# Patient Record
Sex: Male | Born: 1955 | Race: White | Hispanic: No | Marital: Married | Smoking: Never smoker
Health system: Southern US, Community
[De-identification: ages and names within clinical notes are randomized; demographics above are authoritative.]

## PROBLEM LIST (undated history)

## (undated) DIAGNOSIS — J310 Chronic rhinitis: Secondary | ICD-10-CM

## (undated) DIAGNOSIS — R011 Cardiac murmur, unspecified: Secondary | ICD-10-CM

## (undated) DIAGNOSIS — D649 Anemia, unspecified: Secondary | ICD-10-CM

## (undated) DIAGNOSIS — Q676 Pectus excavatum: Secondary | ICD-10-CM

## (undated) DIAGNOSIS — Z8619 Personal history of other infectious and parasitic diseases: Secondary | ICD-10-CM

## (undated) DIAGNOSIS — I340 Nonrheumatic mitral (valve) insufficiency: Secondary | ICD-10-CM

## (undated) DIAGNOSIS — G4730 Sleep apnea, unspecified: Secondary | ICD-10-CM

## (undated) DIAGNOSIS — K589 Irritable bowel syndrome without diarrhea: Secondary | ICD-10-CM

## (undated) DIAGNOSIS — D369 Benign neoplasm, unspecified site: Secondary | ICD-10-CM

## (undated) DIAGNOSIS — E291 Testicular hypofunction: Secondary | ICD-10-CM

## (undated) HISTORY — DX: Personal history of other infectious and parasitic diseases: Z86.19

## (undated) HISTORY — DX: Sleep apnea, unspecified: G47.30

## (undated) HISTORY — DX: Irritable bowel syndrome, unspecified: K58.9

## (undated) HISTORY — DX: Nonrheumatic mitral (valve) insufficiency: I34.0

## (undated) HISTORY — PX: MITRAL VALVE REPAIR: SHX2039

## (undated) HISTORY — DX: Testicular hypofunction: E29.1

---
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
## 1976-03-21 HISTORY — PX: VASECTOMY: SHX75

## 2004-06-11 ENCOUNTER — Ambulatory Visit: Payer: Self-pay

## 2005-05-05 ENCOUNTER — Ambulatory Visit: Payer: Self-pay | Admitting: Family Medicine

## 2008-07-11 ENCOUNTER — Ambulatory Visit: Payer: Self-pay | Admitting: Gastroenterology

## 2012-02-24 ENCOUNTER — Ambulatory Visit: Payer: Self-pay | Admitting: Gastroenterology

## 2012-02-24 DIAGNOSIS — Z86010 Personal history of colonic polyps: Secondary | ICD-10-CM | POA: Insufficient documentation

## 2012-02-24 DIAGNOSIS — Z860101 Personal history of adenomatous and serrated colon polyps: Secondary | ICD-10-CM | POA: Insufficient documentation

## 2012-02-24 LAB — HM COLONOSCOPY

## 2012-04-09 LAB — HM HEPATITIS C SCREENING LAB: HM HEPATITIS C SCREENING: NEGATIVE

## 2013-08-06 ENCOUNTER — Ambulatory Visit: Payer: Self-pay | Admitting: Family Medicine

## 2014-01-19 DEATH — deceased

## 2014-05-02 ENCOUNTER — Emergency Department: Payer: Self-pay | Admitting: Emergency Medicine

## 2015-03-20 ENCOUNTER — Telehealth: Payer: Self-pay | Admitting: Family Medicine

## 2015-03-20 NOTE — Telephone Encounter (Signed)
Left patient a voicemail advising him that a brief letter written by Mikki Santee is at the front desk for pickup, and per Mikki Santee if letter is not adequate to discuss with Dr. Rosanna Randy next week.

## 2015-03-20 NOTE — Telephone Encounter (Signed)
I have placed a brief note up front for pickup. If this is not adequate he will need to discuss this further with Dr. Rosanna Randy next week.

## 2015-03-20 NOTE — Telephone Encounter (Signed)
Pt needs a note for his flex spending saying he takes flonase OTC and has been purchasing this all year.  He needs this today in order to file the claim.  His call back is [phone]  Thanks Con Memos

## 2015-06-16 ENCOUNTER — Encounter: Payer: Self-pay | Admitting: Family Medicine

## 2015-06-16 ENCOUNTER — Ambulatory Visit (INDEPENDENT_AMBULATORY_CARE_PROVIDER_SITE_OTHER): Payer: Commercial Managed Care - PPO | Admitting: Family Medicine

## 2015-06-16 VITALS — BP 102/62 | HR 75 | Temp 98.3°F | Resp 16 | Wt 171.0 lb

## 2015-06-16 DIAGNOSIS — J101 Influenza due to other identified influenza virus with other respiratory manifestations: Secondary | ICD-10-CM | POA: Diagnosis not present

## 2015-06-16 DIAGNOSIS — R05 Cough: Secondary | ICD-10-CM

## 2015-06-16 DIAGNOSIS — E291 Testicular hypofunction: Secondary | ICD-10-CM | POA: Insufficient documentation

## 2015-06-16 DIAGNOSIS — J0110 Acute frontal sinusitis, unspecified: Secondary | ICD-10-CM | POA: Diagnosis not present

## 2015-06-16 DIAGNOSIS — R1110 Vomiting, unspecified: Secondary | ICD-10-CM

## 2015-06-16 DIAGNOSIS — S39012A Strain of muscle, fascia and tendon of lower back, initial encounter: Secondary | ICD-10-CM | POA: Insufficient documentation

## 2015-06-16 DIAGNOSIS — G4730 Sleep apnea, unspecified: Secondary | ICD-10-CM | POA: Insufficient documentation

## 2015-06-16 DIAGNOSIS — M545 Low back pain, unspecified: Secondary | ICD-10-CM | POA: Insufficient documentation

## 2015-06-16 DIAGNOSIS — I340 Nonrheumatic mitral (valve) insufficiency: Secondary | ICD-10-CM | POA: Insufficient documentation

## 2015-06-16 DIAGNOSIS — R059 Cough, unspecified: Secondary | ICD-10-CM

## 2015-06-16 DIAGNOSIS — IMO0001 Reserved for inherently not codable concepts without codable children: Secondary | ICD-10-CM | POA: Insufficient documentation

## 2015-06-16 DIAGNOSIS — J309 Allergic rhinitis, unspecified: Secondary | ICD-10-CM | POA: Insufficient documentation

## 2015-06-16 DIAGNOSIS — I341 Nonrheumatic mitral (valve) prolapse: Secondary | ICD-10-CM | POA: Insufficient documentation

## 2015-06-16 LAB — POCT INFLUENZA A/B
INFLUENZA B, POC: POSITIVE — AB
Influenza A, POC: NEGATIVE

## 2015-06-16 MED ORDER — OSELTAMIVIR PHOSPHATE 75 MG PO CAPS: 75.0000 mg | ORAL_CAPSULE | Freq: Two times a day (BID) | ORAL | Status: AC

## 2015-06-16 MED ORDER — AZITHROMYCIN 250 MG PO TABS: ORAL_TABLET | ORAL | Status: AC

## 2015-06-16 NOTE — Progress Notes (Signed)
       Patient: Michael Macias Male    DOB: Jul 19, 1955   60 y.o.   MRN: [id number] Visit Date: 06/16/2015  Today's Provider: Lelon Huh, MD    Subjective:    Cough This is a new problem. The current episode started in the past 7 days (5 days ago). The problem has been unchanged. The problem occurs constantly. The cough is productive of sputum. Associated symptoms include chest pain, headaches, nasal congestion, postnasal drip and rhinorrhea. Pertinent negatives include no chills, ear congestion, ear pain, fever, heartburn, hemoptysis, myalgias, rash, sore throat, shortness of breath, sweats or wheezing. Treatments tried: robitussin. The treatment provided moderate relief. His past medical history is significant for environmental allergies. There is no history of asthma, bronchiectasis, bronchitis, COPD, emphysema or pneumonia.   Sinus drainage started 5 days ago. Symptoms of cough, sweats, chills, body aches, sinus pressure, and chest congestion. Cough started started 3 days ago.    Allergies  Allergen Reactions  . Penicillins    Previous Medications   FLAXSEED, LINSEED, (FLAX SEED OIL PO)    For eyes   FLUTICASONE (FLONASE) 50 MCG/ACT NASAL SPRAY    Place 2 sprays into the nose daily.   TESTOSTERONE (TESTIM) 50 MG/5GM (1%) GEL    Place 1 packet onto the skin daily.    Review of Systems  Constitutional: Negative for fever and chills.  HENT: Positive for postnasal drip and rhinorrhea. Negative for ear pain and sore throat.   Respiratory: Positive for cough. Negative for hemoptysis, shortness of breath and wheezing.   Cardiovascular: Positive for chest pain.  Gastrointestinal: Negative for heartburn.  Musculoskeletal: Negative for myalgias.  Skin: Negative for rash.  Allergic/Immunologic: Positive for environmental allergies.  Neurological: Positive for headaches.    Social History  Substance Use Topics  . Smoking status: Never Smoker   . Smokeless tobacco: Never Used  .  Alcohol Use: 0.0 oz/week    0 Standard drinks or equivalent per week     Comment: drinks a couple of glasses of wine everyday   Objective:   BP 102/62 mmHg  Pulse 75  Temp(Src) 98.3 F (36.8 C) (Oral)  Resp 16  Wt 171 lb (77.565 kg)  SpO2 95%  Physical Exam  General Appearance:    Alert, cooperative, no distress  HENT:   bilateral TM normal without fluid or infection, neck without nodes, throat normal without erythema or exudate, frontal and maxillary sinus tender and nasal mucosa pale and congested  Eyes:    PERRL, conjunctiva/corneas clear, EOM's intact       Lungs:     Clear to auscultation bilaterally, respirations unlabored  Heart:    Regular rate and rhythm, II/VI systolic murmur  Neurologic:   Awake, alert, oriented x 3. No apparent focal neurological           defect.         Results for orders placed or performed in visit on 06/16/15  POCT Influenza A/B  Result Value Ref Range   Influenza A, POC Negative Negative   Influenza B, POC Positive (A) Negative       Assessment & Plan:     1. Cough  - POCT Influenza A/B  2. Influenza B Tqmiflu 75 bid x 5 d   3. Acute frontal sinusitis, recurrence not specified Zpack       Lelon Huh, MD  [facility]

## 2015-06-22 ENCOUNTER — Encounter: Payer: Self-pay | Admitting: Family Medicine

## 2015-09-03 DIAGNOSIS — E782 Mixed hyperlipidemia: Secondary | ICD-10-CM | POA: Insufficient documentation

## 2015-10-07 ENCOUNTER — Encounter: Payer: Self-pay | Admitting: *Deleted

## 2015-10-07 ENCOUNTER — Encounter
Admission: RE | Disposition: A | Discharge status: Home / Self Care | Payer: Self-pay | Source: Ambulatory Visit | Attending: Internal Medicine

## 2015-10-07 ENCOUNTER — Ambulatory Visit
Admission: RE | Admit: 2015-10-07 | Discharge: 2015-10-07 | Disposition: A | Discharge status: Home / Self Care | Payer: Commercial Managed Care - PPO | Source: Ambulatory Visit | Attending: Internal Medicine | Admitting: Internal Medicine

## 2015-10-07 DIAGNOSIS — Z9889 Other specified postprocedural states: Secondary | ICD-10-CM | POA: Insufficient documentation

## 2015-10-07 DIAGNOSIS — Z7951 Long term (current) use of inhaled steroids: Secondary | ICD-10-CM | POA: Diagnosis not present

## 2015-10-07 DIAGNOSIS — I340 Nonrheumatic mitral (valve) insufficiency: Secondary | ICD-10-CM | POA: Diagnosis not present

## 2015-10-07 DIAGNOSIS — Z7982 Long term (current) use of aspirin: Secondary | ICD-10-CM | POA: Diagnosis not present

## 2015-10-07 DIAGNOSIS — I517 Cardiomegaly: Secondary | ICD-10-CM | POA: Insufficient documentation

## 2015-10-07 DIAGNOSIS — E785 Hyperlipidemia, unspecified: Secondary | ICD-10-CM | POA: Diagnosis not present

## 2015-10-07 DIAGNOSIS — Z86010 Personal history of colonic polyps: Secondary | ICD-10-CM | POA: Insufficient documentation

## 2015-10-07 DIAGNOSIS — I10 Essential (primary) hypertension: Secondary | ICD-10-CM | POA: Insufficient documentation

## 2015-10-07 DIAGNOSIS — I341 Nonrheumatic mitral (valve) prolapse: Secondary | ICD-10-CM | POA: Diagnosis present

## 2015-10-07 DIAGNOSIS — Z9852 Vasectomy status: Secondary | ICD-10-CM | POA: Insufficient documentation

## 2015-10-07 DIAGNOSIS — G4730 Sleep apnea, unspecified: Secondary | ICD-10-CM | POA: Insufficient documentation

## 2015-10-07 DIAGNOSIS — J309 Allergic rhinitis, unspecified: Secondary | ICD-10-CM | POA: Insufficient documentation

## 2015-10-07 DIAGNOSIS — Z79899 Other long term (current) drug therapy: Secondary | ICD-10-CM | POA: Insufficient documentation

## 2015-10-07 DIAGNOSIS — Z881 Allergy status to other antibiotic agents status: Secondary | ICD-10-CM | POA: Diagnosis not present

## 2015-10-07 HISTORY — PX: CARDIAC CATHETERIZATION: SHX172

## 2015-10-07 SURGERY — RIGHT/LEFT HEART CATH AND CORONARY ANGIOGRAPHY
Anesthesia: Moderate Sedation

## 2015-10-07 SURGERY — ECHOCARDIOGRAM, TRANSESOPHAGEAL
Anesthesia: Moderate Sedation

## 2015-10-07 MED ORDER — SODIUM CHLORIDE 0.9 % WEIGHT BASED INFUSION: 1.0000 mL/kg/h | INTRAVENOUS | Status: DC

## 2015-10-07 MED ORDER — MIDAZOLAM HCL 2 MG/2ML IJ SOLN
INTRAMUSCULAR | Status: DC | PRN
  Administered 2015-10-07: 1 mg via INTRAVENOUS

## 2015-10-07 MED ORDER — MIDAZOLAM HCL 5 MG/5ML IJ SOLN
INTRAMUSCULAR | Status: AC | PRN
  Administered 2015-10-07 (×2): 2 mg via INTRAVENOUS

## 2015-10-07 MED ORDER — BUTAMBEN-TETRACAINE-BENZOCAINE 2-2-14 % EX AERO
INHALATION_SPRAY | CUTANEOUS | Status: AC
  Filled 2015-10-07: qty 20

## 2015-10-07 MED ORDER — SODIUM CHLORIDE 0.9% FLUSH: 3.0000 mL | INTRAVENOUS | Status: DC | PRN

## 2015-10-07 MED ORDER — LIDOCAINE VISCOUS 2 % MT SOLN
OROMUCOSAL | Status: AC
  Filled 2015-10-07: qty 15

## 2015-10-07 MED ORDER — MIDAZOLAM HCL 5 MG/5ML IJ SOLN
INTRAMUSCULAR | Status: AC
  Filled 2015-10-07: qty 5

## 2015-10-07 MED ORDER — FENTANYL CITRATE (PF) 100 MCG/2ML IJ SOLN
INTRAMUSCULAR | Status: AC | PRN
  Administered 2015-10-07 (×2): 50 ug via INTRAVENOUS

## 2015-10-07 MED ORDER — SODIUM CHLORIDE 0.9 % WEIGHT BASED INFUSION
3.0000 mL/kg/h | INTRAVENOUS | Status: DC
  Administered 2015-10-07: 3 mL/kg/h via INTRAVENOUS

## 2015-10-07 MED ORDER — SODIUM CHLORIDE 0.9 % WEIGHT BASED INFUSION: 3.0000 mL/kg/h | INTRAVENOUS | Status: DC

## 2015-10-07 MED ORDER — SODIUM CHLORIDE 0.9% FLUSH: 3.0000 mL | Freq: Two times a day (BID) | INTRAVENOUS | Status: DC

## 2015-10-07 MED ORDER — FENTANYL CITRATE (PF) 100 MCG/2ML IJ SOLN
INTRAMUSCULAR | Status: AC
  Filled 2015-10-07: qty 2

## 2015-10-07 MED ORDER — ASPIRIN 81 MG PO CHEW: 81.0000 mg | CHEWABLE_TABLET | ORAL | Status: DC

## 2015-10-07 MED ORDER — SODIUM CHLORIDE 0.9 % IV SOLN: 250.0000 mL | INTRAVENOUS | Status: DC | PRN

## 2015-10-07 MED ORDER — SODIUM CHLORIDE 0.9 % IV SOLN
INTRAVENOUS | Status: DC
  Administered 2015-10-07: 10:00:00 via INTRAVENOUS

## 2015-10-07 SURGICAL SUPPLY — 13 items
CATH INFINITI 5FR ANG PIGTAIL (CATHETERS) ×3 IMPLANT
CATH INFINITI 5FR JL4 (CATHETERS) ×3 IMPLANT
CATH INFINITI JR4 5F (CATHETERS) ×3 IMPLANT
CATH SWANZ 7F THERMO (CATHETERS) ×3 IMPLANT
DEVICE CLOSURE MYNXGRIP 5F (Vascular Products) ×3 IMPLANT
GUIDEWIRE EMER 3M J .025X150CM (WIRE) ×3 IMPLANT
KIT MANI 3VAL PERCEP (MISCELLANEOUS) ×3 IMPLANT
KIT RIGHT HEART (MISCELLANEOUS) ×3 IMPLANT
NEEDLE PERC 18GX7CM (NEEDLE) ×3 IMPLANT
PACK CARDIAC CATH (CUSTOM PROCEDURE TRAY) ×3 IMPLANT
SHEATH PINNACLE 5F 10CM (SHEATH) ×3 IMPLANT
SHEATH PINNACLE 7F 10CM (SHEATH) ×3 IMPLANT
WIRE EMERALD 3MM-J .035X150CM (WIRE) ×3 IMPLANT

## 2015-10-07 NOTE — Discharge Instructions (Signed)

## 2015-10-07 NOTE — Progress Notes (Signed)
*  PRELIMINARY RESULTS* Echocardiogram Echocardiogram Transesophageal has been performed.  Sherrie Sport 10/07/2015, 8:44 AM

## 2015-12-21 DIAGNOSIS — D62 Acute posthemorrhagic anemia: Secondary | ICD-10-CM | POA: Insufficient documentation

## 2015-12-21 DIAGNOSIS — G8918 Other acute postprocedural pain: Secondary | ICD-10-CM | POA: Insufficient documentation

## 2015-12-22 DIAGNOSIS — Z9889 Other specified postprocedural states: Secondary | ICD-10-CM | POA: Insufficient documentation

## 2015-12-23 DIAGNOSIS — Q676 Pectus excavatum: Secondary | ICD-10-CM | POA: Insufficient documentation

## 2016-02-26 DIAGNOSIS — R0782 Intercostal pain: Secondary | ICD-10-CM | POA: Insufficient documentation

## 2016-04-22 ENCOUNTER — Encounter: Payer: Self-pay | Admitting: Family Medicine

## 2016-04-22 ENCOUNTER — Ambulatory Visit (INDEPENDENT_AMBULATORY_CARE_PROVIDER_SITE_OTHER): Payer: Commercial Managed Care - PPO | Admitting: Family Medicine

## 2016-04-22 VITALS — BP 110/72 | HR 84 | Temp 98.5°F | Resp 16 | Wt 170.0 lb

## 2016-04-22 DIAGNOSIS — R194 Change in bowel habit: Secondary | ICD-10-CM

## 2016-04-22 NOTE — Progress Notes (Signed)
Patient: Michael Macias Male    DOB: 03-Jul-1955   61 y.o.   MRN: [id number] Visit Date: 04/22/2016  Today's Provider: Lelon Huh, MD   Chief Complaint  Patient presents with  . Nausea   Subjective:    HPI  Patient comes in today c/o nausea. He reports that his symptoms come and go. He has had symptoms for about 2 weeks. Patient denies any abdominal pain. He denies any fever. He has had diarrhea, but it has not been consistent. Patient denies any bloody or black stools. He reports that his symptoms seem to be worse at night. He has been taking Imodium to help relieve the diarrhea which has helped.  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 Has been trying to eat healthier eating more vegetables the last two months. No constipation. Has taken occasional Koa    Allergies  Allergen Reactions  . Penicillins Swelling and Rash    Has patient had a PCN reaction causing immediate rash, facial/tongue/throat swelling, SOB or lightheadedness with hypotension: Yes Has patient had a PCN reaction causing severe rash involving mucus membranes or skin necrosis: No Has patient had a PCN reaction that required hospitalization No Has patient had a PCN reaction occurring within the last 10 years: Yes If all of the above answers are "NO", then may proceed with Cephalosporin use.      Current Outpatient Prescriptions:  .  fluticasone (FLONASE) 50 MCG/ACT nasal spray, Place 2 sprays into the nose daily., Disp: , Rfl:  .  testosterone (TESTIM) 50 MG/5GM (1%) GEL, Place 1 packet onto the skin daily., Disp: , Rfl:   Review of Systems  Constitutional: Positive for appetite change. Negative for activity change, chills, diaphoresis, fatigue, fever and unexpected weight change.  Gastrointestinal: Positive for diarrhea and nausea. Negative for abdominal distention, abdominal pain, blood in stool, constipation, rectal pain and vomiting.  Genitourinary: Negative for difficulty urinating, dysuria,  flank pain, frequency and hematuria.  Neurological: Negative for weakness, light-headedness and headaches.    Social History  Substance Use Topics  . Smoking status: Never Smoker  . Smokeless tobacco: Never Used  . Alcohol use 0.0 oz/week     Comment: drinks a couple of glasses of wine everyday   Objective:   BP 110/72 (BP Location: Right Arm, Patient Position: Sitting, Cuff Size: Normal)   Pulse 84   Temp 98.5 F (36.9 C)   Resp 16   Wt 170 lb (77.1 kg)   SpO2 96%   BMI 28.29 kg/m   Physical Exam   General Appearance:    Alert, cooperative, no distress, obese  Eyes:    PERRL, conjunctiva/corneas clear, EOM's intact       Lungs:     Clear to auscultation bilaterally, respirations unlabored  Heart:    Regular rate and rhythm  Neurologic:   Awake, alert, oriented x 3. No apparent focal neurological           defect.            Assessment & Plan:     1. Change in bowel habits  - T4 AND TSH - Comprehensive metabolic panel - CBC - C difficile Toxins A+B W/Rflx - Fecal leukocytes - Ova and parasite examination - Stool culture  Patient Instructions   Try taking one heaping tablespoon metamucil mixed with liquid drink daily   Start taking an OTC    probiotic supplement  Such as Align once a day  The entirety of the information documented in the History of Present Illness, Review of Systems and Physical Exam were personally obtained by me. Portions of this information were initially documented by Wilburt Finlay, CMA and reviewed by me for thoroughness and accuracy.    Lelon Huh, MD  [facility]

## 2016-04-22 NOTE — Patient Instructions (Addendum)
   Try taking one heaping tablespoon metamucil mixed with liquid drink daily   Start taking an OTC    probiotic supplement  Such as Align once a day

## 2016-04-23 LAB — COMPREHENSIVE METABOLIC PANEL
A/G RATIO: 1.9 (ref 1.2–2.2)
ALK PHOS: 60 IU/L (ref 39–117)
ALT: 28 IU/L (ref 0–44)
AST: 24 IU/L (ref 0–40)
Albumin: 4.8 g/dL (ref 3.5–5.5)
BILIRUBIN TOTAL: 0.5 mg/dL (ref 0.0–1.2)
BUN/Creatinine Ratio: 17 (ref 9–20)
BUN: 18 mg/dL (ref 6–24)
CHLORIDE: 99 mmol/L (ref 96–106)
CO2: 24 mmol/L (ref 18–29)
Calcium: 9.8 mg/dL (ref 8.7–10.2)
Creatinine, Ser: 1.04 mg/dL (ref 0.76–1.27)
GFR calc Af Amer: 90 mL/min/{1.73_m2} (ref >59)
GFR, EST NON AFRICAN AMERICAN: 78 mL/min/{1.73_m2} (ref >59)
GLOBULIN, TOTAL: 2.5 g/dL (ref 1.5–4.5)
Glucose: 94 mg/dL (ref 65–99)
POTASSIUM: 4.7 mmol/L (ref 3.5–5.2)
SODIUM: 140 mmol/L (ref 134–144)
Total Protein: 7.3 g/dL (ref 6.0–8.5)

## 2016-04-23 LAB — T4 AND TSH
T4, Total: 5.9 ug/dL (ref 4.5–12.0)
TSH: 6.46 u[IU]/mL — AB (ref 0.450–4.500)

## 2016-04-23 LAB — CBC
Hematocrit: 41.6 % (ref 37.5–51.0)
Hemoglobin: 14.3 g/dL (ref 13.0–17.7)
MCH: 32.1 pg (ref 26.6–33.0)
MCHC: 34.4 g/dL (ref 31.5–35.7)
MCV: 94 fL (ref 79–97)
PLATELETS: 306 10*3/uL (ref 150–379)
RBC: 4.45 x10E6/uL (ref 4.14–5.80)
RDW: 13.1 % (ref 12.3–15.4)
WBC: 8.7 10*3/uL (ref 3.4–10.8)

## 2016-04-26 ENCOUNTER — Telehealth: Payer: Self-pay

## 2016-04-26 NOTE — Telephone Encounter (Signed)
-----   Message from Birdie Sons, MD sent at 04/24/2016  2:25 PM EST ----- He is slightly hyPOthyroid, but this should not be causing his symptoms. If not doing better then need referral to GI.

## 2016-04-26 NOTE — Telephone Encounter (Signed)
Pt reports he is feeling some better. Will call back at the end of the week if he feels he needs referral. Renaldo Fiddler, CMA

## 2016-05-04 ENCOUNTER — Telehealth: Payer: Self-pay

## 2016-05-04 DIAGNOSIS — R194 Change in bowel habit: Secondary | ICD-10-CM

## 2016-05-04 NOTE — Telephone Encounter (Signed)
Okay to refer. I have not seen patient since at least 2016

## 2016-05-04 NOTE — Telephone Encounter (Signed)
Please advise referral?  

## 2016-05-04 NOTE — Telephone Encounter (Signed)
I believe you saw pt for change in bowel in habits earlier this month. Is it okay to refer? Who would you recommend? Renaldo Fiddler, CMA

## 2016-05-04 NOTE — Telephone Encounter (Signed)
Patient is requesting to proceed with GI referral. He said he is not getting any better, and was told to call back if he needed to be referred.   CB#[id number].

## 2016-05-25 ENCOUNTER — Ambulatory Visit (INDEPENDENT_AMBULATORY_CARE_PROVIDER_SITE_OTHER): Payer: Commercial Managed Care - PPO | Admitting: Family Medicine

## 2016-05-25 VITALS — BP 110/78 | HR 64 | Temp 97.5°F | Resp 16 | Ht 64.5 in | Wt 172.0 lb

## 2016-05-25 DIAGNOSIS — Z1211 Encounter for screening for malignant neoplasm of colon: Secondary | ICD-10-CM | POA: Diagnosis not present

## 2016-05-25 DIAGNOSIS — Z0000 Encounter for general adult medical examination without abnormal findings: Secondary | ICD-10-CM

## 2016-05-25 DIAGNOSIS — Z125 Encounter for screening for malignant neoplasm of prostate: Secondary | ICD-10-CM

## 2016-05-25 DIAGNOSIS — E291 Testicular hypofunction: Secondary | ICD-10-CM

## 2016-05-25 NOTE — Progress Notes (Signed)
Patient: Michael Macias, Male    DOB: 08-07-1955, 61 y.o.   MRN: [id number] Visit Date: 05/25/2016  Today's Provider: Wilhemena Durie, MD   Chief Complaint  Patient presents with  . Annual Exam   Subjective:   Michael Macias is a 61 y.o. male who presents today for his Subsequent Annual Wellness Visit. He feels well. He reports exercising 5 days per week. He reports he is sleeping poorly.  Immunization History  Administered Date(s) Administered  . Tdap 04/04/2012   02/24/2012 Colonoscopy-Sessile serrated adenoma, repeat in 3 years  Review of Systems  Constitutional: Negative.   HENT: Negative.   Eyes: Negative.   Respiratory: Negative.   Cardiovascular: Negative.   Gastrointestinal: Negative.   Endocrine: Negative.   Genitourinary: Negative.        Pt has had some recent rectal discomfort which is improving. Also 4-5 liquid stools daily-some nocturnal,also improving.  Musculoskeletal: Negative.   Skin: Negative.   Allergic/Immunologic: Negative.   Neurological: Negative.   Hematological: Negative.   Psychiatric/Behavioral: Positive for sleep disturbance (falls asleep well but then wakes and is unable to go back.).    Patient Active Problem List   Diagnosis Date Noted  . 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 Apnea, sleep 06/16/2015  . Low back strain 06/16/2015  . Mitral regurgitation 06/16/2015  . Allergic rhinitis 06/16/2015  . Low back pain radiating to right leg 06/16/2015  . Regurgitation 06/16/2015  . History of adenomatous polyp of colon 02/24/2012  . IBS (irritable bowel syndrome) 02/01/2008    Social History   Social History  . Marital status: Married    Spouse name: N/A  . Number of children: 2  . Years of education: N/A   Occupational History  . [employer] Procedure department    Social History Main Topics  . Smoking status: Never Smoker  . Smokeless tobacco: Never Used  . Alcohol use 0.0 oz/week     Comment: drinks a couple of glasses of  wine everyday  . Drug use: No  . Sexual activity: Not on file   Other Topics Concern  . Not on file   Social History Narrative  . No narrative on file    Past Surgical History:  Procedure Laterality Date  . CARDIAC CATHETERIZATION N/A 10/07/2015   Procedure: Right/Left Heart Cath and Coronary Angiography;  Surgeon: Corey Skains, MD;  Location: [facility];  Service: Cardiovascular;  Laterality: N/A;  . RHINOPLASTY  1970   for deviated septum  . VASECTOMY  1978    His family history includes Breast cancer in his mother; Cancer in his maternal grandfather; Dementia in his father; Diabetes in his mother; Hyperlipidemia in his mother; Hypertension in his mother; Irritable bowel syndrome in his sister; Leukemia in his father; Pancreatic cancer in his maternal grandmother.     Outpatient Encounter Prescriptions as of 05/25/2016  Medication Sig  . aspirin EC 81 MG tablet Take 81 mg by mouth daily.  . fluticasone (FLONASE) 50 MCG/ACT nasal spray Place 2 sprays into the nose daily.  . Multiple Vitamins-Minerals (EYE VITAMINS) CAPS Take 1 capsule by mouth daily.  . Omega-3 Fatty Acids (FISH OIL) 1200 MG CAPS Take 1 capsule by mouth daily.  Marland Kitchen testosterone (TESTIM) 50 MG/5GM (1%) GEL Place 1 packet onto the skin daily.   No facility-administered encounter medications on file as of 05/25/2016.     Allergies  Allergen Reactions  . Amoxicillin Rash  . Penicillins Swelling and Rash  Has patient had a PCN reaction causing immediate rash, facial/tongue/throat swelling, SOB or lightheadedness with hypotension: Yes Has patient had a PCN reaction causing severe rash involving mucus membranes or skin necrosis: No Has patient had a PCN reaction that required hospitalization No Has patient had a PCN reaction occurring within the last 10 years: Yes If all of the above answers are "NO", then may proceed with Cephalosporin use. Has patient had a PCN reaction causing immediate rash,  facial/tongue/throat swelling, SOB or lightheadedness with hypotension: Yes Has patient had a PCN reaction causing severe rash involving mucus membranes or skin necrosis: No Has patient had a PCN reaction that required hospitalization No Has patient had a PCN reaction occurring within the last 10 years: Yes If all of the above answers are "NO", then may proceed with Cephalosporin use.     Patient Care Team: Jerrol Banana., MD as PCP - General (Family Medicine) Lollie Sails, MD as Consulting Physician (Gastroenterology) Augustina Mood, PA-C (Dentistry) Royston Cowper, MD (Urology)   Objective:   Vitals:  Vitals:   05/25/16 0910  BP: 110/78  Pulse: 64  Resp: 16  Temp: 97.5 F (36.4 C)  TempSrc: Oral  Weight: 172 lb (78 kg)  Height: 5' 4.5" (1.638 m)    Physical Exam  Constitutional: He is oriented to person, place, and time. He appears well-developed and well-nourished.  HENT:  Head: Normocephalic and atraumatic.  Right Ear: External ear normal.  Left Ear: External ear normal.  Nose: Nose normal.  Mouth/Throat: Oropharynx is clear and moist.  Eyes: Conjunctivae and EOM are normal. Pupils are equal, round, and reactive to light.  Neck: Normal range of motion. Neck supple.  Cardiovascular: Normal rate, regular rhythm, normal heart sounds and intact distal pulses.   Pulmonary/Chest: Effort normal and breath sounds normal.  Abdominal: Soft. Bowel sounds are normal.  Genitourinary: Penis normal.  Musculoskeletal: Normal range of motion.  Neurological: He is alert and oriented to person, place, and time.  Skin: Skin is warm and dry.  Psychiatric: He has a normal mood and affect. His behavior is normal. Judgment and thought content normal.    Activities of Daily Living In your present state of health, do you have any difficulty performing the following activities: 05/25/2016  Hearing? N  Vision? N  Difficulty concentrating or making decisions? N  Walking or  climbing stairs? N  Doing errands, shopping? N  Some recent data might be hidden    Fall Risk Assessment Fall Risk  05/25/2016  Falls in the past year? No     Depression Screen PHQ 2/9 Scores 05/25/2016  PHQ - 2 Score 0   Current Exercise Habits: Structured exercise class, Time (Minutes): 30, Frequency (Times/Week): 5, Weekly Exercise (Minutes/Week): 150 Exercise limited by: cardiac condition(s)    Assessment & Plan:     Annual Wellness Visit  Reviewed patient's Family Medical History Reviewed and updated list of patient's medical providers Assessment of cognitive impairment was done Assessed patient's functional ability Established a written schedule for health screening [place] Completed and Reviewed  Exercise Activities and Dietary recommendations Goals    None      Immunization History  Administered Date(s) Administered  . Tdap 04/04/2012    Health Maintenance  Topic Date Due  . HIV Screening  11/20/1971  . COLONOSCOPY  02/24/2015  . INFLUENZA VACCINE  10/20/2015  . TETANUS/TDAP  04/04/2022  . Hepatitis C Screening  Completed    Refer for colonoscopy. Discussed  health benefits of physical activity, and encouraged him to engage in regular exercise appropriate for his age and condition.  I have done the exam and reviewed the chart and it is accurate to the best of my knowledge. Development worker, community has been used and  any errors in dictation or transcription are unintentional. Miguel Aschoff M.D. [facility]

## 2016-05-27 ENCOUNTER — Telehealth: Payer: Self-pay | Admitting: Emergency Medicine

## 2016-05-27 ENCOUNTER — Other Ambulatory Visit: Payer: Self-pay | Admitting: Emergency Medicine

## 2016-05-27 DIAGNOSIS — E291 Testicular hypofunction: Secondary | ICD-10-CM

## 2016-05-27 LAB — LIPID PANEL WITH LDL/HDL RATIO
CHOLESTEROL TOTAL: 218 mg/dL — AB (ref 100–199)
HDL: 47 mg/dL (ref >39)
LDL CALC: 151 mg/dL — AB (ref 0–99)
LDl/HDL Ratio: 3.2 ratio units (ref 0.0–3.6)
Triglycerides: 99 mg/dL (ref 0–149)
VLDL CHOLESTEROL CAL: 20 mg/dL (ref 5–40)

## 2016-05-27 LAB — CBC WITH DIFFERENTIAL/PLATELET
BASOS ABS: 0.1 10*3/uL (ref 0.0–0.2)
Basos: 1 %
EOS (ABSOLUTE): 0.2 10*3/uL (ref 0.0–0.4)
Eos: 4 %
HEMOGLOBIN: 15.5 g/dL (ref 13.0–17.7)
Hematocrit: 45 % (ref 37.5–51.0)
IMMATURE GRANS (ABS): 0 10*3/uL (ref 0.0–0.1)
Immature Granulocytes: 0 %
LYMPHS: 36 %
Lymphocytes Absolute: 2 10*3/uL (ref 0.7–3.1)
MCH: 32.8 pg (ref 26.6–33.0)
MCHC: 34.4 g/dL (ref 31.5–35.7)
MCV: 95 fL (ref 79–97)
MONOCYTES: 9 %
Monocytes Absolute: 0.5 10*3/uL (ref 0.1–0.9)
Neutrophils Absolute: 2.9 10*3/uL (ref 1.4–7.0)
Neutrophils: 50 %
PLATELETS: 285 10*3/uL (ref 150–379)
RBC: 4.72 x10E6/uL (ref 4.14–5.80)
RDW: 13.8 % (ref 12.3–15.4)
WBC: 5.7 10*3/uL (ref 3.4–10.8)

## 2016-05-27 LAB — COMPREHENSIVE METABOLIC PANEL
ALBUMIN: 5.1 g/dL (ref 3.5–5.5)
ALK PHOS: 56 IU/L (ref 39–117)
ALT: 26 IU/L (ref 0–44)
AST: 24 IU/L (ref 0–40)
Albumin/Globulin Ratio: 1.8 (ref 1.2–2.2)
BUN / CREAT RATIO: 16 (ref 9–20)
BUN: 15 mg/dL (ref 6–24)
Bilirubin Total: 0.7 mg/dL (ref 0.0–1.2)
CO2: 24 mmol/L (ref 18–29)
CREATININE: 0.94 mg/dL (ref 0.76–1.27)
Calcium: 10 mg/dL (ref 8.7–10.2)
Chloride: 101 mmol/L (ref 96–106)
GFR calc Af Amer: 102 mL/min/{1.73_m2} (ref >59)
GFR calc non Af Amer: 88 mL/min/{1.73_m2} (ref >59)
GLUCOSE: 101 mg/dL — AB (ref 65–99)
Globulin, Total: 2.8 g/dL (ref 1.5–4.5)
Potassium: 5.8 mmol/L — ABNORMAL HIGH (ref 3.5–5.2)
Sodium: 143 mmol/L (ref 134–144)
TOTAL PROTEIN: 7.9 g/dL (ref 6.0–8.5)

## 2016-05-27 LAB — TESTOSTERONE: Testosterone: 254 ng/dL — ABNORMAL LOW (ref 264–916)

## 2016-05-27 LAB — PSA: PROSTATE SPECIFIC AG, SERUM: 0.3 ng/mL (ref 0.0–4.0)

## 2016-05-27 LAB — PROLACTIN: Prolactin: 8 ng/mL (ref 4.0–15.2)

## 2016-05-27 LAB — TSH: TSH: 9.34 u[IU]/mL — ABNORMAL HIGH (ref 0.450–4.500)

## 2016-05-27 NOTE — Telephone Encounter (Signed)
Testosterone is low, potassium is high. Cholesterol is borderline. TSH is up also showing hypothyroid. All other labs are normal. Should follow up with Dr. Darnell Level on treatment options.

## 2016-05-27 NOTE — Telephone Encounter (Signed)
Patient called back and wanted to have another provider to look at his results please. Dr Alben Spittle patient. Please let him know today he asked. Thank you-aa

## 2016-05-27 NOTE — Telephone Encounter (Signed)
Patient advised of all information below. Patient is not on any thyroid medication. Currently is not using Testim gel and said you guys decided to defer to urology about that. Please review results. Patient advised we will call him back once we hear from Dr Rometta Emery

## 2016-05-27 NOTE — Telephone Encounter (Signed)
Please call pt, Im on the phone :)-aa

## 2016-05-27 NOTE — Telephone Encounter (Signed)
Pt requesting lab results. Informed you are not in office on fridays but wanted me to sent you a message.

## 2016-05-30 NOTE — Telephone Encounter (Signed)
Pt stated he is still waiting to hear back from Dr. Rosanna Randy and request a call back. Please advise. Thanks TNP

## 2016-05-31 MED ORDER — TESTOSTERONE 50 MG/5GM (1%) TD GEL
10.0000 g | Freq: Every day | TRANSDERMAL | 5 refills | Status: DC
Start: 2016-05-31 — End: 2016-06-01

## 2016-05-31 NOTE — Telephone Encounter (Signed)
Hypothyroid--start synthroid 79mcg daily. Testosterone still low--double dosing of topical testoserone. RTC 3 months. Will also repeat renal panel in addition to TSH and Testosterone. LMTCB on 2 numbers. Dr Rosanna Randy reviewed results today.-aa

## 2016-05-31 NOTE — Telephone Encounter (Signed)
Double dose on the Sig.

## 2016-05-31 NOTE — Telephone Encounter (Signed)
Advised patient as below. Patient is requesting that testosterone cream be called into the pharmacy as well. Being that you want the patient to take a double dose, should I keep the Sig the same? Please advise. Thanks!

## 2016-06-01 ENCOUNTER — Other Ambulatory Visit: Payer: Self-pay

## 2016-06-01 DIAGNOSIS — E291 Testicular hypofunction: Secondary | ICD-10-CM

## 2016-06-01 MED ORDER — TESTOSTERONE 50 MG/5GM (1%) TD GEL: TRANSDERMAL | 5 refills | Status: DC

## 2016-06-01 MED ORDER — LEVOTHYROXINE SODIUM 50 MCG PO TABS: 50.0000 ug | ORAL_TABLET | Freq: Every day | ORAL | 3 refills | Status: DC

## 2016-07-25 ENCOUNTER — Other Ambulatory Visit: Payer: Self-pay

## 2016-10-11 ENCOUNTER — Ambulatory Visit (INDEPENDENT_AMBULATORY_CARE_PROVIDER_SITE_OTHER): Payer: Commercial Managed Care - PPO | Admitting: Family Medicine

## 2016-10-11 VITALS — BP 110/70 | HR 64 | Temp 97.0°F | Resp 16 | Wt 180.0 lb

## 2016-10-11 DIAGNOSIS — E291 Testicular hypofunction: Secondary | ICD-10-CM | POA: Diagnosis not present

## 2016-10-11 DIAGNOSIS — E039 Hypothyroidism, unspecified: Secondary | ICD-10-CM

## 2016-10-11 DIAGNOSIS — E875 Hyperkalemia: Secondary | ICD-10-CM

## 2016-10-11 DIAGNOSIS — Z6829 Body mass index (BMI) 29.0-29.9, adult: Secondary | ICD-10-CM

## 2016-10-11 NOTE — Progress Notes (Signed)
Michael Macias  MRN: [id number] DOB: 1956/02/23  Subjective:  HPI   The patient is a 61 year old male who presents for follow up on Thyroid disease and Hypogonadism.  His last visit was on  05/25/16.  At that time his labs revealed an elevated TSH, Low Testosterone and elevated Potassium.    The patient was started on Levothyroxine for the thyroid disease.  He reports that since starting it he has gained 8 pounds and feels bloated and sometime a little strange sensation, such as being in a fog and unable to concentrate.  This does not last long but happens about 1 hour after taking his medication.    The patient is also here for his testosterone level.  He was instructed to double the dose of Testim  He reports that he has been trying to do this.  However, when he does use the increased dose he finds that it breaks him out on his face only.  He states that he thinks his follow up appointment with Dr Rogers Blocker is in August.  On his last visit he also had some complaints of stomach issues and noted it was time for his colonoscopy.  He has that scheduled for August 3 with Dr Vira Agar.  While in our office last time office he mentioned the GI complaints and was instructed to start Metamucil 4 tabs daily and Probiotic every other day.  He reports that he thinks this has been helping.  The patient had a slightly elevated potassium on his last labs and needs to have this rechecked today.  Patient has a BMI of 29.10 on his last visit.   His weight has gone up 8 pounds since that time.  He has been started on the thryoid medications and labs will be checked today.  Patient Active Problem List   Diagnosis Date Noted  . 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 Apnea, sleep 06/16/2015  . Low back strain 06/16/2015  . Mitral regurgitation 06/16/2015  . Allergic rhinitis 06/16/2015  . Low back pain radiating to right leg 06/16/2015  . Regurgitation 06/16/2015  . History of adenomatous polyp of colon 02/24/2012  .  IBS (irritable bowel syndrome) 02/01/2008    Past Medical History:  Diagnosis Date  . History of chicken pox   . History of measles   . History of mumps   . Hypogonadism male   . IBS (irritable bowel syndrome)   . Mitral regurgitation   . Sleep apnea     Social History   Social History  . Marital status: Married    Spouse name: N/A  . Number of children: 2  . Years of education: N/A   Occupational History  . [employer] department    Social History Main Topics  . Smoking status: Never Smoker  . Smokeless tobacco: Never Used  . Alcohol use 0.0 oz/week     Comment: drinks a couple of glasses of wine everyday  . Drug use: No  . Sexual activity: Not on file   Other Topics Concern  . Not on file   Social History Narrative  . No narrative on file    Outpatient Encounter Prescriptions as of 10/11/2016  Medication Sig  . aspirin EC 81 MG tablet Take 81 mg by mouth daily.  . fluticasone (FLONASE) 50 MCG/ACT nasal spray Place 2 sprays into the nose daily.  Marland Kitchen levothyroxine (SYNTHROID, LEVOTHROID) 50 MCG tablet Take 1 tablet (50 mcg total) by mouth daily.  Marland Kitchen  Melatonin 10 MG CAPS Take 1 capsule by mouth.  . Multiple Vitamins-Minerals (EYE VITAMINS) CAPS Take 1 capsule by mouth daily.  . Omega-3 Fatty Acids (FISH OIL) 1200 MG CAPS Take 1 capsule by mouth daily.  Marland Kitchen testosterone (TESTIM) 50 MG/5GM (1%) GEL Apply 2 tubes daily   No facility-administered encounter medications on file as of 10/11/2016.     Allergies  Allergen Reactions  . Amoxicillin Rash  . Penicillins Swelling and Rash    Has patient had a PCN reaction causing immediate rash, facial/tongue/throat swelling, SOB or lightheadedness with hypotension: Yes Has patient had a PCN reaction causing severe rash involving mucus membranes or skin necrosis: No Has patient had a PCN reaction that required hospitalization No Has patient had a PCN reaction occurring within the last 10 years: Yes If all of  the above answers are "NO", then may proceed with Cephalosporin use. Has patient had a PCN reaction causing immediate rash, facial/tongue/throat swelling, SOB or lightheadedness with hypotension: Yes Has patient had a PCN reaction causing severe rash involving mucus membranes or skin necrosis: No Has patient had a PCN reaction that required hospitalization No Has patient had a PCN reaction occurring within the last 10 years: Yes If all of the above answers are "NO", then may proceed with Cephalosporin use.     Review of Systems  Constitutional: Negative for fever and malaise/fatigue.  Respiratory: Positive for cough (? allergies). Negative for shortness of breath and wheezing.   Cardiovascular: Negative for chest pain, palpitations, orthopnea, claudication and leg swelling.  Neurological: Negative for weakness.  Psychiatric/Behavioral: Negative for depression, hallucinations, memory loss, substance abuse and suicidal ideas. The patient is not nervous/anxious and does not have insomnia.     Objective:  BP 110/70 (BP Location: Right Arm, Patient Position: Sitting, Cuff Size: Normal)   Pulse 64   Temp (!) 97 F (36.1 C) (Oral)   Resp 16   Wt 180 lb (81.6 kg)   BMI 30.42 kg/m   Physical Exam  Constitutional: He is well-developed, well-nourished, and in no distress.  HENT:  Head: Normocephalic.  Eyes: Pupils are equal, round, and reactive to light.  Neck: Normal range of motion.  Cardiovascular: Normal rate, regular rhythm and normal heart sounds.   Pulmonary/Chest: Effort normal and breath sounds normal.  Psychiatric: Mood, memory, affect and judgment normal.    Assessment and Plan :   1. Male hypogonadism Patient is going to bee seeing Dr Rogers Blocker for this issue.  Will await lab results and have copy availalble to him. - Testosterone  2. Acquired hypothyroidism Obtain labs today - TSH  3. Hyperkalemia Recheck labs today - Renal function panel  4. BMI  29.0-29.9,adult Patient has recently been diagnosed with hypothyroidism.  He has started treatment.  Have discussed also with the patient working on habits.      HPI, Exam and A&P Transcribed under the direction and in the presence of Miguel Aschoff, Brooke Bonito., MD. Electronically Signed: Althea Charon, RMA I have done the exam and reviewed the chart and it is accurate to the best of my knowledge. Development worker, community has been used and  any errors in dictation or transcription are unintentional. Miguel Aschoff M.D. [facility]

## 2016-10-11 NOTE — Patient Instructions (Signed)
Keep appointments with Urology and Gastroenterology.  Bring Testosterone level with you to the Urology appointment

## 2016-10-12 ENCOUNTER — Telehealth: Payer: Self-pay

## 2016-10-12 LAB — RENAL FUNCTION PANEL
Albumin: 4.7 g/dL (ref 3.5–5.5)
BUN/Creatinine Ratio: 13 (ref 9–20)
BUN: 14 mg/dL (ref 6–24)
CO2: 23 mmol/L (ref 20–29)
Calcium: 9.9 mg/dL (ref 8.7–10.2)
Chloride: 101 mmol/L (ref 96–106)
Creatinine, Ser: 1.08 mg/dL (ref 0.76–1.27)
GFR calc Af Amer: 86 mL/min/{1.73_m2} (ref >59)
GFR calc non Af Amer: 75 mL/min/{1.73_m2} (ref >59)
Glucose: 106 mg/dL — ABNORMAL HIGH (ref 65–99)
Phosphorus: 3.7 mg/dL (ref 2.5–4.5)
Potassium: 5.6 mmol/L — ABNORMAL HIGH (ref 3.5–5.2)
Sodium: 140 mmol/L (ref 134–144)

## 2016-10-12 LAB — TSH: TSH: 4.76 u[IU]/mL — ABNORMAL HIGH (ref 0.450–4.500)

## 2016-10-12 LAB — TESTOSTERONE: Testosterone: 607 ng/dL (ref 264–916)

## 2016-10-12 MED ORDER — LEVOTHYROXINE SODIUM 75 MCG PO TABS: 75.0000 ug | ORAL_TABLET | Freq: Every day | ORAL | 3 refills | Status: DC

## 2016-10-12 NOTE — Telephone Encounter (Signed)
Patient advised. RX for Levothyroxine [facility] mcg sent to [facility].

## 2016-10-20 ENCOUNTER — Encounter: Payer: Self-pay | Admitting: *Deleted

## 2016-10-21 ENCOUNTER — Ambulatory Visit: Payer: Commercial Managed Care - PPO | Admitting: Anesthesiology

## 2016-10-21 ENCOUNTER — Ambulatory Visit
Admission: RE | Admit: 2016-10-21 | Discharge: 2016-10-21 | Disposition: A | Discharge status: Home / Self Care | Payer: Commercial Managed Care - PPO | Source: Ambulatory Visit | Attending: Gastroenterology | Admitting: Gastroenterology

## 2016-10-21 ENCOUNTER — Encounter
Admission: RE | Disposition: A | Discharge status: Home / Self Care | Payer: Self-pay | Source: Ambulatory Visit | Attending: Gastroenterology

## 2016-10-21 ENCOUNTER — Encounter: Payer: Self-pay | Admitting: *Deleted

## 2016-10-21 DIAGNOSIS — K635 Polyp of colon: Secondary | ICD-10-CM | POA: Insufficient documentation

## 2016-10-21 DIAGNOSIS — D649 Anemia, unspecified: Secondary | ICD-10-CM | POA: Diagnosis not present

## 2016-10-21 DIAGNOSIS — Z7982 Long term (current) use of aspirin: Secondary | ICD-10-CM | POA: Diagnosis not present

## 2016-10-21 DIAGNOSIS — E230 Hypopituitarism: Secondary | ICD-10-CM | POA: Diagnosis not present

## 2016-10-21 DIAGNOSIS — I340 Nonrheumatic mitral (valve) insufficiency: Secondary | ICD-10-CM | POA: Diagnosis not present

## 2016-10-21 DIAGNOSIS — K640 First degree hemorrhoids: Secondary | ICD-10-CM | POA: Insufficient documentation

## 2016-10-21 DIAGNOSIS — Z86010 Personal history of colonic polyps: Secondary | ICD-10-CM | POA: Diagnosis not present

## 2016-10-21 DIAGNOSIS — Q676 Pectus excavatum: Secondary | ICD-10-CM | POA: Insufficient documentation

## 2016-10-21 DIAGNOSIS — K5730 Diverticulosis of large intestine without perforation or abscess without bleeding: Secondary | ICD-10-CM | POA: Diagnosis not present

## 2016-10-21 DIAGNOSIS — Z880 Allergy status to penicillin: Secondary | ICD-10-CM | POA: Diagnosis not present

## 2016-10-21 DIAGNOSIS — G4730 Sleep apnea, unspecified: Secondary | ICD-10-CM | POA: Insufficient documentation

## 2016-10-21 DIAGNOSIS — Z1211 Encounter for screening for malignant neoplasm of colon: Secondary | ICD-10-CM | POA: Insufficient documentation

## 2016-10-21 DIAGNOSIS — K621 Rectal polyp: Secondary | ICD-10-CM | POA: Diagnosis not present

## 2016-10-21 DIAGNOSIS — D120 Benign neoplasm of cecum: Secondary | ICD-10-CM | POA: Diagnosis not present

## 2016-10-21 DIAGNOSIS — D122 Benign neoplasm of ascending colon: Secondary | ICD-10-CM | POA: Diagnosis not present

## 2016-10-21 DIAGNOSIS — K589 Irritable bowel syndrome without diarrhea: Secondary | ICD-10-CM | POA: Diagnosis not present

## 2016-10-21 HISTORY — DX: Pectus excavatum: Q67.6

## 2016-10-21 HISTORY — DX: Anemia, unspecified: D64.9

## 2016-10-21 HISTORY — DX: Cardiac murmur, unspecified: R01.1

## 2016-10-21 HISTORY — DX: Chronic rhinitis: J31.0

## 2016-10-21 HISTORY — DX: Benign neoplasm, unspecified site: D36.9

## 2016-10-21 HISTORY — PX: COLONOSCOPY WITH PROPOFOL: SHX5780

## 2016-10-21 SURGERY — COLONOSCOPY WITH PROPOFOL
Anesthesia: General

## 2016-10-21 MED ORDER — MIDAZOLAM HCL 5 MG/5ML IJ SOLN
INTRAMUSCULAR | Status: DC | PRN
  Administered 2016-10-21: 2 mg via INTRAVENOUS

## 2016-10-21 MED ORDER — PROPOFOL 10 MG/ML IV BOLUS
INTRAVENOUS | Status: DC | PRN
  Administered 2016-10-21: 20 mg via INTRAVENOUS
  Administered 2016-10-21: 50 mg via INTRAVENOUS

## 2016-10-21 MED ORDER — LIDOCAINE HCL (PF) 2 % IJ SOLN
INTRAMUSCULAR | Status: DC | PRN
Start: 2016-10-21 — End: 2016-10-21
  Administered 2016-10-21: 50 mg

## 2016-10-21 MED ORDER — PROPOFOL 500 MG/50ML IV EMUL
INTRAVENOUS | Status: DC | PRN
  Administered 2016-10-21: 75 ug/kg/min via INTRAVENOUS

## 2016-10-21 MED ORDER — CLINDAMYCIN PHOSPHATE 600 MG/50ML IV SOLN
600.0000 mg | Freq: Once | INTRAVENOUS | Status: AC
  Administered 2016-10-21: 600 mg via INTRAVENOUS
  Filled 2016-10-21: qty 50

## 2016-10-21 MED ORDER — PHENYLEPHRINE HCL 10 MG/ML IJ SOLN
INTRAMUSCULAR | Status: DC | PRN
  Administered 2016-10-21: 100 ug via INTRAVENOUS

## 2016-10-21 MED ORDER — SODIUM CHLORIDE 0.9 % IV SOLN
INTRAVENOUS | Status: DC
  Administered 2016-10-21: 13:00:00 via INTRAVENOUS

## 2016-10-21 MED ORDER — FENTANYL CITRATE (PF) 100 MCG/2ML IJ SOLN
INTRAMUSCULAR | Status: AC
  Filled 2016-10-21: qty 2

## 2016-10-21 MED ORDER — MIDAZOLAM HCL 2 MG/2ML IJ SOLN
INTRAMUSCULAR | Status: AC
  Filled 2016-10-21: qty 2

## 2016-10-21 MED ORDER — SODIUM CHLORIDE 0.9 % IV SOLN: INTRAVENOUS | Status: DC

## 2016-10-21 MED ORDER — FENTANYL CITRATE (PF) 100 MCG/2ML IJ SOLN
INTRAMUSCULAR | Status: DC | PRN
  Administered 2016-10-21 (×2): 50 ug via INTRAVENOUS

## 2016-10-21 MED ORDER — LIDOCAINE HCL (PF) 2 % IJ SOLN
INTRAMUSCULAR | Status: AC
  Filled 2016-10-21: qty 2

## 2016-10-21 NOTE — Anesthesia Post-op Follow-up Note (Cosign Needed)
Anesthesia QCDR form completed.        

## 2016-10-21 NOTE — Transfer of Care (Signed)
Immediate Anesthesia Transfer of Care Note  Patient: Michael Macias  Procedure(s) Performed: Procedure(s): COLONOSCOPY WITH PROPOFOL (N/A)  Patient Location: PACU  Anesthesia Type:General  Level of Consciousness: sedated  Airway & Oxygen Therapy: Patient Spontanous Breathing and Patient connected to nasal cannula oxygen  Post-op Assessment: Report given to RN and Post -op Vital signs reviewed and stable  Post vital signs: Reviewed and stable  Last Vitals:  Vitals:   10/21/16 1210  BP: (!) 133/96  Pulse: 78  Resp: 18  Temp: 36.6 C    Last Pain:  Vitals:   10/21/16 1210  TempSrc: Tympanic         Complications: No apparent anesthesia complications

## 2016-10-21 NOTE — Anesthesia Preprocedure Evaluation (Signed)
Anesthesia Evaluation  Patient identified by MRN, date of birth, ID band Patient awake    Reviewed: Allergy & Precautions, H&P , NPO status , Patient's Chart, lab work & pertinent test results, reviewed documented beta blocker date and time   Airway Mallampati: II   Neck ROM: full    Dental  (+) Poor Dentition   Pulmonary neg pulmonary ROS,    Pulmonary exam normal        Cardiovascular negative cardio ROS Normal cardiovascular exam+ Valvular Problems/Murmurs  Rhythm:regular Rate:Normal     Neuro/Psych  Neuromuscular disease negative neurological ROS  negative psych ROS   GI/Hepatic negative GI ROS, Neg liver ROS,   Endo/Other  negative endocrine ROS  Renal/GU negative Renal ROS  negative genitourinary   Musculoskeletal   Abdominal   Peds  Hematology negative hematology ROS (+) anemia ,   Anesthesia Other Findings Past Medical History: No date: Adenomatous polyps No date: Anemia No date: Heart murmur No date: History of chicken pox No date: History of measles No date: History of mumps No date: Hypogonadism male No date: IBS (irritable bowel syndrome) No date: Mitral regurgitation No date: Pectus excavatum No date: Rhinitis No date: Sleep apnea Past Surgical History: 10/07/2015: CARDIAC CATHETERIZATION; N/A     Comment:  Procedure: Right/Left Heart Cath and Coronary               Angiography;  Surgeon: Corey Skains, MD;  Location:               [place] [facility];  Service: Cardiovascular;                Laterality: N/A; No date: MITRAL VALVE REPAIR 1970: RHINOPLASTY     Comment:  for deviated septum 1978: VASECTOMY   Reproductive/Obstetrics negative OB ROS                             Anesthesia Physical Anesthesia Plan  ASA: II  Anesthesia Plan: General   Post-op Pain Management:    Induction:   PONV Risk Score and Plan:   Airway Management Planned:    Additional Equipment:   Intra-op Plan:   Post-operative Plan:   Informed Consent: I have reviewed the patients History and Physical, chart, labs and discussed the procedure including the risks, benefits and alternatives for the proposed anesthesia with the patient or authorized representative who has indicated his/her understanding and acceptance.   Dental Advisory Given  Plan Discussed with: CRNA  Anesthesia Plan Comments:         Anesthesia Quick Evaluation

## 2016-10-21 NOTE — Op Note (Signed)
[facility] Gastroenterology Patient Name: Stepen Prins Procedure Date: 10/21/2016 12:36 PM MRN: [id number] Account #: [id number] Date of Birth: February 28, 1956 Admit Type: Outpatient Age: 61 Room: [facility] ENDO ROOM 1 Gender: Male Note Status: Finalized Procedure:            Colonoscopy Indications:          Personal history of colonic polyps Providers:            Lollie Sails, MD Referring MD:         Janine Ores. Rosanna Randy, MD (Referring MD) Medicines:            Monitored Anesthesia Care Complications:        No immediate complications. Procedure:            Pre-Anesthesia Assessment:                       - ASA Grade Assessment: III - A patient with severe                        systemic disease.                       After obtaining informed consent, the colonoscope was                        passed under direct vision. Throughout the procedure,                        the patient's blood pressure, pulse, and oxygen                        saturations were monitored continuously. The                        Colonoscope was introduced through the anus and                        advanced to the the cecum, identified by appendiceal                        orifice and ileocecal valve. The colonoscopy was                        performed without difficulty. The patient tolerated the                        procedure well. The quality of the bowel preparation                        was good except the ascending colon was fair. Findings:      Two sessile polyps were found in the cecum. The polyps were 1 mm in       size. These polyps were removed with a cold biopsy forceps. Resection       and retrieval were complete.      A 2 mm polyp was found in the proximal ascending colon. The polyp was       sessile. The polyp was removed with a cold biopsy forceps. Resection and       retrieval were complete.      A 5 mm polyp was found in  the ascending colon. The polyp was   semi-pedunculated. The polyp was removed with a cold snare. Resection       and retrieval were complete.      A 3 mm polyp was found in the rectum. The polyp was sessile. The polyp       was removed with a cold biopsy forceps. Resection and retrieval were       complete.      Multiple small-mouthed diverticula were found in the sigmoid colon and       distal descending colon.      Non-bleeding internal hemorrhoids were found during anoscopy. The       hemorrhoids were small and Grade I (internal hemorrhoids that do not       prolapse).      The digital rectal exam was normal. Impression:           - Two 1 mm polyps in the cecum, removed with a cold                        biopsy forceps. Resected and retrieved.                       - One 2 mm polyp in the proximal ascending colon,                        removed with a cold biopsy forceps. Resected and                        retrieved.                       - One 5 mm polyp in the ascending colon, removed with a                        cold snare. Resected and retrieved.                       - One 3 mm polyp in the rectum, removed with a cold                        biopsy forceps. Resected and retrieved.                       - Diverticulosis in the sigmoid colon and in the distal                        descending colon.                       - Non-bleeding internal hemorrhoids. Recommendation:       - Await pathology results.                       - Telephone GI clinic for pathology results in 1 week. Procedure Code(s):    --- Professional ---                       [phone], Colonoscopy, flexible; with removal of tumor(s),                        polyp(s), or other lesion(s) by snare technique  01093, 27, Colonoscopy, flexible; with biopsy, single                        or multiple Diagnosis Code(s):    --- Professional ---                       D12.0, Benign neoplasm of cecum                       D12.2, Benign  neoplasm of ascending colon                       K62.1, Rectal polyp                       K64.0, First degree hemorrhoids                       Z86.010, Personal history of colonic polyps                       K57.30, Diverticulosis of large intestine without                        perforation or abscess without bleeding CPT copyright 2016 American Medical Association. All rights reserved. The codes documented in this report are preliminary and upon coder review may  be revised to meet current compliance requirements. Lollie Sails, MD 10/21/2016 1:45:47 PM This report has been signed electronically. Number of Addenda: 0 Note Initiated On: 10/21/2016 12:36 PM Scope Withdrawal Time: 0 hours 21 minutes 7 seconds  Total Procedure Duration: 0 hours 32 minutes 10 seconds       [facility]

## 2016-10-21 NOTE — Anesthesia Postprocedure Evaluation (Signed)
Anesthesia Post Note  Patient: Michael Macias  Procedure(s) Performed: Procedure(s) (LRB): COLONOSCOPY WITH PROPOFOL (N/A)  Patient location during evaluation: PACU Anesthesia Type: General Level of consciousness: awake and alert and oriented Pain management: pain level controlled Vital Signs Assessment: post-procedure vital signs reviewed and stable Respiratory status: spontaneous breathing Cardiovascular status: blood pressure returned to baseline Anesthetic complications: no     Last Vitals:  Vitals:   10/21/16 1405 10/21/16 1415  BP: 109/86 117/88  Pulse: 66 64  Resp: 16 16  Temp:      Last Pain:  Vitals:   10/21/16 1345  TempSrc: Tympanic                 Braeleigh Pyper

## 2016-10-21 NOTE — H&P (Signed)
Outpatient short stay form Pre-procedure 10/21/2016 12:59 PM Michael Sails MD  Primary Physician: Dr. Lelon Huh  Reason for visit:  Colonoscopy  History of present illness:  Patient is a 61 year old male setting today as above. He has personal history of adenomatous colon polyps. He tolerated his prep well. He does take a 81 mg aspirin daily this is been held for a couple days. He takes no other aspirin products or blood thinning agents. It is of note that he is receiving antibiotic prophylaxis today, clindamycin IV, because of the placement of a mitral valve ring about a year ago.    Current Facility-Administered Medications:  .  0.9 %  sodium chloride infusion, , Intravenous, Continuous, Michael Sails, MD, Last Rate: 20 mL/hr at 10/21/16 1230 .  0.9 %  sodium chloride infusion, , Intravenous, Continuous, Michael Sails, MD .  0.9 %  sodium chloride infusion, , Intravenous, Continuous, Michael Sails, MD .  clindamycin (CLEOCIN) IVPB 600 mg, 600 mg, Intravenous, Once, Michael Sails, MD, Last Rate: 100 mL/hr at 10/21/16 1253, 600 mg at 10/21/16 1253  Prescriptions Prior to Admission  Medication Sig Dispense Refill Last Dose  . aspirin EC 81 MG tablet Take 81 mg by mouth daily.   Past Week at Unknown time  . fluticasone (FLONASE) 50 MCG/ACT nasal spray Place 2 sprays into the nose daily.   10/21/2016 at Unknown time  . levothyroxine (SYNTHROID, LEVOTHROID) 75 MCG tablet Take 1 tablet (75 mcg total) by mouth daily before breakfast. 90 tablet 3 10/21/2016 at Unknown time  . Melatonin 10 MG CAPS Take 1 capsule by mouth.   10/20/2016 at Unknown time  . Multiple Vitamins-Minerals (EYE VITAMINS) CAPS Take 1 capsule by mouth daily.   Past Week at Unknown time  . Omega-3 Fatty Acids (FISH OIL) 1200 MG CAPS Take 1 capsule by mouth daily.   Past Week at Unknown time  . psyllium (REGULOID) 0.52 g capsule Take 4 capsules by mouth daily.   Past Week at Unknown time  . tadalafil  (CIALIS) 20 MG tablet Take 20 mg by mouth daily as needed for erectile dysfunction.     Marland Kitchen testosterone (TESTIM) 50 MG/5GM (1%) GEL Apply 2 tubes daily 60 Tube 5 Past Week at Unknown time     Allergies  Allergen Reactions  . Amoxicillin Rash  . Penicillins Swelling and Rash    Has patient had a PCN reaction causing immediate rash, facial/tongue/throat swelling, SOB or lightheadedness with hypotension: Yes Has patient had a PCN reaction causing severe rash involving mucus membranes or skin necrosis: No Has patient had a PCN reaction that required hospitalization No Has patient had a PCN reaction occurring within the last 10 years: Yes If all of the above answers are "NO", then may proceed with Cephalosporin use. Has patient had a PCN reaction causing immediate rash, facial/tongue/throat swelling, SOB or lightheadedness with hypotension: Yes Has patient had a PCN reaction causing severe rash involving mucus membranes or skin necrosis: No Has patient had a PCN reaction that required hospitalization No Has patient had a PCN reaction occurring within the last 10 years: Yes If all of the above answers are "NO", then may proceed with Cephalosporin use.      Past Medical History:  Diagnosis Date  . Adenomatous polyps   . Anemia   . Heart murmur   . History of chicken pox   . History of measles   . History of mumps   . Hypogonadism male   .  IBS (irritable bowel syndrome)   . Mitral regurgitation   . Pectus excavatum   . Rhinitis   . Sleep apnea     Review of systems:      Physical Exam    Heart and lungs: Regular rate and rhythm without rub or gallop, lungs are bilaterally clear.    HEENT: Normocephalic atraumatic eyes are anicteric    Other:     Pertinant exam for procedure: Soft nontender nondistended bowel sounds positive normoactive.    Planned proceedures: Colonoscopy and indicated procedures. I have discussed the risks benefits and complications of procedures to  include not limited to bleeding, infection, perforation and the risk of sedation and the patient wishes to proceed.    Michael Sails, MD Gastroenterology 10/21/2016  12:59 PM

## 2016-10-24 ENCOUNTER — Encounter: Payer: Self-pay | Admitting: Gastroenterology

## 2016-10-24 LAB — SURGICAL PATHOLOGY

## 2016-11-23 ENCOUNTER — Encounter: Payer: Self-pay | Admitting: Family Medicine

## 2016-12-08 ENCOUNTER — Encounter: Payer: Self-pay | Admitting: Family Medicine

## 2017-04-04 DIAGNOSIS — M25562 Pain in left knee: Secondary | ICD-10-CM | POA: Insufficient documentation

## 2017-04-04 DIAGNOSIS — M7122 Synovial cyst of popliteal space [Baker], left knee: Secondary | ICD-10-CM | POA: Insufficient documentation

## 2017-04-13 ENCOUNTER — Ambulatory Visit: Payer: Self-pay | Admitting: Family Medicine

## 2017-04-20 DIAGNOSIS — S83242A Other tear of medial meniscus, current injury, left knee, initial encounter: Secondary | ICD-10-CM | POA: Insufficient documentation

## 2017-05-22 ENCOUNTER — Telehealth: Payer: Self-pay | Admitting: Family Medicine

## 2017-05-22 NOTE — Telephone Encounter (Signed)
Time for appt for this anyway.

## 2017-05-22 NOTE — Telephone Encounter (Signed)
Michael Macias, Patient is returning your call.  Please call him back  [phone]

## 2017-05-22 NOTE — Telephone Encounter (Signed)
Pt stated he was returning Elena's call. Please advise. Thanks TNP

## 2017-05-22 NOTE — Telephone Encounter (Signed)
Appointment made

## 2017-05-22 NOTE — Telephone Encounter (Signed)
LMT[facility] ED 

## 2017-05-22 NOTE — Telephone Encounter (Signed)
Pt is requesting a call back to see if changing his levothyroxine (SYNTHROID, LEVOTHROID) 75 MCG tablet to Armour Thyroid would be an option. Pt stated that he doesn't thinks Levothyroxine is helping him. Pt stated that he doesn't feel well and that he really can't describe it. [facility]. Please advise. Thanks TNP

## 2017-06-01 ENCOUNTER — Ambulatory Visit: Payer: PRIVATE HEALTH INSURANCE | Admitting: Family Medicine

## 2017-06-01 ENCOUNTER — Other Ambulatory Visit: Payer: Self-pay

## 2017-06-01 VITALS — BP 114/76 | HR 92 | Temp 98.0°F | Resp 16

## 2017-06-01 DIAGNOSIS — E291 Testicular hypofunction: Secondary | ICD-10-CM

## 2017-06-01 DIAGNOSIS — R5383 Other fatigue: Secondary | ICD-10-CM

## 2017-06-01 DIAGNOSIS — G4700 Insomnia, unspecified: Secondary | ICD-10-CM

## 2017-06-01 DIAGNOSIS — E785 Hyperlipidemia, unspecified: Secondary | ICD-10-CM | POA: Diagnosis not present

## 2017-06-01 NOTE — Progress Notes (Signed)
Michael Macias  MRN: [id number] DOB: 1956/02/05  Subjective:  HPI   The patient is a 62 year old male who presents for evaluation of fatigue and insomnia.  The patient states that for the last 2 months he has been having difficulty staying asleep at night and feeling tired a lot.  He is on Levothyroxine and on his last check 7 months ago his level was adjusted.   He is also due for all other labs.   Pt does snore and has had sleep evaluation "through my dentist."  Patient Active Problem List   Diagnosis Date Noted  . 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 Apnea, sleep 06/16/2015  . Low back strain 06/16/2015  . Mitral regurgitation 06/16/2015  . Allergic rhinitis 06/16/2015  . Low back pain radiating to right leg 06/16/2015  . Regurgitation 06/16/2015  . History of adenomatous polyp of colon 02/24/2012  . IBS (irritable bowel syndrome) 02/01/2008    Past Medical History:  Diagnosis Date  . Adenomatous polyps   . Anemia   . Heart murmur   . History of chicken pox   . History of measles   . History of mumps   . Hypogonadism male   . IBS (irritable bowel syndrome)   . Mitral regurgitation   . Pectus excavatum   . Rhinitis   . Sleep apnea     Social History   Socioeconomic History  . Marital status: Married    Spouse name: Not on file  . Number of children: 2  . Years of education: Not on file  . Highest education level: Not on file  Social Needs  . Financial resource strain: Not on file  . Food insecurity - worry: Not on file  . Food insecurity - inability: Not on file  . Transportation needs - medical: Not on file  . Transportation needs - non-medical: Not on file  Occupational History  . 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 Smoking status: Never Smoker  . Smokeless tobacco: Never Used  Substance and Sexual Activity  . Alcohol use: Yes    Alcohol/week: 0.0 oz    Comment: drinks a couple of glasses of wine everyday  . Drug use: No   . Sexual activity: Not on file  Other Topics Concern  . Not on file  Social History Narrative  . Not on file    Outpatient Encounter Medications as of 06/01/2017  Medication Sig  . aspirin EC 81 MG tablet Take 81 mg by mouth daily.  . fluticasone (FLONASE) 50 MCG/ACT nasal spray Place 2 sprays into the nose daily.  Marland Kitchen levothyroxine (SYNTHROID, LEVOTHROID) 75 MCG tablet Take 1 tablet (75 mcg total) by mouth daily before breakfast.  . Multiple Vitamins-Minerals (EYE VITAMINS) CAPS Take 1 capsule by mouth daily.  . Omega-3 Fatty Acids (FISH OIL) 1200 MG CAPS Take 1 capsule by mouth daily.  . psyllium (REGULOID) 0.52 g capsule Take 4 capsules by mouth daily.  . tadalafil (CIALIS) 20 MG tablet Take 20 mg by mouth daily as needed for erectile dysfunction.  Marland Kitchen testosterone (TESTIM) 50 MG/5GM (1%) GEL Apply 2 tubes daily  . [DISCONTINUED] Melatonin 10 MG CAPS Take 1 capsule by mouth.   No facility-administered encounter medications on file as of 06/01/2017.     Allergies  Allergen Reactions  . Amoxicillin Rash  . Penicillins Swelling and Rash    Has patient had a PCN reaction causing immediate rash, facial/tongue/throat swelling, SOB or lightheadedness with  hypotension: Yes Has patient had a PCN reaction causing severe rash involving mucus membranes or skin necrosis: No Has patient had a PCN reaction that required hospitalization No Has patient had a PCN reaction occurring within the last 10 years: Yes If all of the above answers are "NO", then may proceed with Cephalosporin use. Has patient had a PCN reaction causing immediate rash, facial/tongue/throat swelling, SOB or lightheadedness with hypotension: Yes Has patient had a PCN reaction causing severe rash involving mucus membranes or skin necrosis: No Has patient had a PCN reaction that required hospitalization No Has patient had a PCN reaction occurring within the last 10 years: Yes If all of the above answers are "NO", then may proceed  with Cephalosporin use.     Review of Systems  Constitutional: Positive for malaise/fatigue. Negative for fever.  HENT: Negative.   Eyes: Negative.   Respiratory: Negative for cough, shortness of breath and wheezing.   Cardiovascular: Negative for chest pain, palpitations, orthopnea, claudication and leg swelling.  Gastrointestinal: Negative.   Skin: Negative.   Neurological: Negative for weakness.  Endo/Heme/Allergies: Negative.   Psychiatric/Behavioral: Negative for depression, hallucinations, memory loss, substance abuse and suicidal ideas. The patient has insomnia. The patient is not nervous/anxious.     Objective:  BP 114/76 (BP Location: Right Arm, Patient Position: Sitting, Cuff Size: Normal)   Pulse 92   Temp 98 F (36.7 C) (Oral)   Resp 16   Physical Exam  Constitutional: He is oriented to person, place, and time and well-developed, well-nourished, and in no distress.  HENT:  Head: Normocephalic and atraumatic.  Eyes: Conjunctivae are normal. No scleral icterus.  Neck: No thyromegaly present.  Cardiovascular: Normal rate, regular rhythm and normal heart sounds.  Pulmonary/Chest: Effort normal and breath sounds normal.  Abdominal: Soft.  Neurological: He is alert and oriented to person, place, and time.  Skin: Skin is warm and dry.  Psychiatric: Mood, memory, affect and judgment normal.    Assessment and Plan :  1. Male hypogonadism  - Testosterone  2. Other fatigue  - CBC with Differential/Platelet - Comprehensive metabolic panel - TSH - Testosterone  3. Insomnia, unspecified type  - TSH - Testosterone  4. Hyperlipidemia, unspecified hyperlipidemia type  - Lipid Panel With LDL/HDL Ratio 5.OSA Pt wears mouthpiece per dentist. Recommend possible referral to sleep specialist through [facility].  I have done the exam and reviewed the chart and it is accurate to the best of my knowledge. Development worker, community has been used and  any errors in  dictation or transcription are unintentional. Miguel Aschoff M.D. [facility]

## 2017-06-03 LAB — CBC WITH DIFFERENTIAL/PLATELET
BASOS ABS: 0 10*3/uL (ref 0.0–0.2)
Basos: 1 %
EOS (ABSOLUTE): 0.1 10*3/uL (ref 0.0–0.4)
Eos: 3 %
HEMATOCRIT: 47.4 % (ref 37.5–51.0)
Hemoglobin: 16.2 g/dL (ref 13.0–17.7)
Immature Grans (Abs): 0 10*3/uL (ref 0.0–0.1)
Immature Granulocytes: 0 %
LYMPHS ABS: 1.7 10*3/uL (ref 0.7–3.1)
Lymphs: 33 %
MCH: 32.9 pg (ref 26.6–33.0)
MCHC: 34.2 g/dL (ref 31.5–35.7)
MCV: 96 fL (ref 79–97)
MONOS ABS: 0.6 10*3/uL (ref 0.1–0.9)
Monocytes: 11 %
Neutrophils Absolute: 2.6 10*3/uL (ref 1.4–7.0)
Neutrophils: 52 %
PLATELETS: 289 10*3/uL (ref 150–379)
RBC: 4.92 x10E6/uL (ref 4.14–5.80)
RDW: 13 % (ref 12.3–15.4)
WBC: 5.1 10*3/uL (ref 3.4–10.8)

## 2017-06-03 LAB — COMPREHENSIVE METABOLIC PANEL
ALK PHOS: 42 IU/L (ref 39–117)
ALT: 41 IU/L (ref 0–44)
AST: 24 IU/L (ref 0–40)
Albumin/Globulin Ratio: 2 (ref 1.2–2.2)
Albumin: 4.8 g/dL (ref 3.6–4.8)
BUN/Creatinine Ratio: 16 (ref 10–24)
BUN: 16 mg/dL (ref 8–27)
Bilirubin Total: 0.7 mg/dL (ref 0.0–1.2)
CO2: 23 mmol/L (ref 20–29)
CREATININE: 1 mg/dL (ref 0.76–1.27)
Calcium: 9.4 mg/dL (ref 8.6–10.2)
Chloride: 103 mmol/L (ref 96–106)
GFR calc Af Amer: 94 mL/min/{1.73_m2} (ref >59)
GFR calc non Af Amer: 81 mL/min/{1.73_m2} (ref >59)
GLOBULIN, TOTAL: 2.4 g/dL (ref 1.5–4.5)
GLUCOSE: 98 mg/dL (ref 65–99)
Potassium: 4.8 mmol/L (ref 3.5–5.2)
SODIUM: 141 mmol/L (ref 134–144)
Total Protein: 7.2 g/dL (ref 6.0–8.5)

## 2017-06-03 LAB — LIPID PANEL WITH LDL/HDL RATIO
CHOLESTEROL TOTAL: 184 mg/dL (ref 100–199)
HDL: 47 mg/dL (ref >39)
LDL CALC: 122 mg/dL — AB (ref 0–99)
LDl/HDL Ratio: 2.6 ratio (ref 0.0–3.6)
TRIGLYCERIDES: 73 mg/dL (ref 0–149)
VLDL Cholesterol Cal: 15 mg/dL (ref 5–40)

## 2017-06-03 LAB — TSH: TSH: 1.77 u[IU]/mL (ref 0.450–4.500)

## 2017-06-03 LAB — TESTOSTERONE: TESTOSTERONE: 339 ng/dL (ref 264–916)

## 2017-06-06 ENCOUNTER — Telehealth: Payer: Self-pay | Admitting: Family Medicine

## 2017-06-06 NOTE — Telephone Encounter (Signed)
Pt requesting lab results states he needs to know lab results so he can determine what to do about his medication.

## 2017-06-07 ENCOUNTER — Telehealth: Payer: Self-pay

## 2017-06-07 NOTE — Telephone Encounter (Signed)
-----   Message from Jerrol Banana., MD sent at 06/07/2017  2:19 PM EDT ----- Thyroid ok--would double Testim dose--thx

## 2017-06-07 NOTE — Telephone Encounter (Signed)
[id number] 

## 2017-06-08 NOTE — Telephone Encounter (Signed)
Patient has been advised. KW 

## 2017-06-12 ENCOUNTER — Telehealth: Payer: Self-pay | Admitting: Family Medicine

## 2017-06-12 DIAGNOSIS — G4730 Sleep apnea, unspecified: Secondary | ICD-10-CM

## 2017-06-12 NOTE — Telephone Encounter (Signed)
ok 

## 2017-06-12 NOTE — Telephone Encounter (Signed)
Patient called and states that he wants to be set with [facility] for a sleep study. Their phone # is [phone] and the Dr there last name is [employer]

## 2017-06-13 NOTE — Telephone Encounter (Signed)
Here is that information Thanks

## 2017-06-13 NOTE — Telephone Encounter (Signed)
Waiting on referral form from [facility]

## 2017-06-14 NOTE — Telephone Encounter (Signed)
Referral form for [facility] sent to Dr Rosanna Randy to sign

## 2017-06-16 NOTE — Telephone Encounter (Signed)
Referral form for [facility] department was sent back to you to sign.Pt called asking about referral

## 2017-06-22 ENCOUNTER — Telehealth: Payer: Self-pay | Admitting: Family Medicine

## 2017-06-22 NOTE — Telephone Encounter (Signed)
Order for consultation with sleep specialist faxed to [facility]

## 2017-08-01 ENCOUNTER — Other Ambulatory Visit: Payer: Self-pay

## 2017-08-01 DIAGNOSIS — E291 Testicular hypofunction: Secondary | ICD-10-CM

## 2017-08-01 NOTE — Telephone Encounter (Signed)
Patient called requesting refills. Thanks!  

## 2017-08-02 MED ORDER — TESTOSTERONE 50 MG/5GM (1%) TD GEL: TRANSDERMAL | 4 refills | Status: DC

## 2017-08-02 NOTE — Telephone Encounter (Signed)
Patient is calling back regarding refill. He states he only has 5 days left. He would like a call when refills are   called in. CB# [phone]

## 2017-08-03 ENCOUNTER — Other Ambulatory Visit: Payer: Self-pay

## 2017-08-03 MED ORDER — TESTOSTERONE 50 MG/5GM (1%) TD GEL: TRANSDERMAL | 5 refills | Status: DC

## 2017-08-03 NOTE — Telephone Encounter (Signed)
Rx got printed not sent in. Please refill

## 2017-08-15 ENCOUNTER — Telehealth: Payer: Self-pay | Admitting: Family Medicine

## 2017-08-15 NOTE — Telephone Encounter (Signed)
pt called to say [facility] [place] will be [facility] for his Testosterone medication.Marland Kitchen  teri

## 2017-08-17 NOTE — Telephone Encounter (Signed)
Patient is out of testosterone (TESTIM) 50 MG/5GM (1%) GEL and as stated [facility] was [facility] and he has not heard anything.  He is calling to make sure we received that and check the status.

## 2017-08-17 NOTE — Telephone Encounter (Signed)
LMT[facility] ED 

## 2017-08-17 NOTE — Telephone Encounter (Signed)
Advised patient as below.  

## 2017-09-14 ENCOUNTER — Other Ambulatory Visit: Payer: Self-pay | Admitting: Family Medicine

## 2017-11-17 DIAGNOSIS — M19041 Primary osteoarthritis, right hand: Secondary | ICD-10-CM | POA: Insufficient documentation

## 2017-11-17 DIAGNOSIS — M1812 Unilateral primary osteoarthritis of first carpometacarpal joint, left hand: Secondary | ICD-10-CM | POA: Insufficient documentation

## 2017-12-04 ENCOUNTER — Encounter: Payer: PRIVATE HEALTH INSURANCE | Admitting: Family Medicine

## 2017-12-14 ENCOUNTER — Ambulatory Visit
Admission: RE | Admit: 2017-12-14 | Discharge: 2017-12-14 | Disposition: A | Discharge status: Home / Self Care | Payer: 59 | Source: Ambulatory Visit | Attending: Family Medicine | Admitting: Family Medicine

## 2017-12-14 ENCOUNTER — Ambulatory Visit (INDEPENDENT_AMBULATORY_CARE_PROVIDER_SITE_OTHER): Payer: 59 | Admitting: Family Medicine

## 2017-12-14 ENCOUNTER — Other Ambulatory Visit: Payer: Self-pay | Admitting: Family Medicine

## 2017-12-14 VITALS — BP 110/76 | HR 81 | Temp 98.3°F | Resp 16 | Ht 65.0 in | Wt 177.0 lb

## 2017-12-14 DIAGNOSIS — R05 Cough: Secondary | ICD-10-CM

## 2017-12-14 DIAGNOSIS — R059 Cough, unspecified: Secondary | ICD-10-CM

## 2017-12-14 DIAGNOSIS — Z0000 Encounter for general adult medical examination without abnormal findings: Secondary | ICD-10-CM

## 2017-12-14 DIAGNOSIS — Q676 Pectus excavatum: Secondary | ICD-10-CM | POA: Diagnosis not present

## 2017-12-14 MED ORDER — ALBUTEROL SULFATE HFA 108 (90 BASE) MCG/ACT IN AERS: 2.0000 | INHALATION_SPRAY | Freq: Four times a day (QID) | RESPIRATORY_TRACT | 5 refills | Status: AC | PRN

## 2017-12-14 MED ORDER — ALBUTEROL SULFATE HFA 108 (90 BASE) MCG/ACT IN AERS: 2.0000 | INHALATION_SPRAY | RESPIRATORY_TRACT | 2 refills | Status: DC | PRN

## 2017-12-14 NOTE — Progress Notes (Signed)
Patient: Michael Macias, Male    DOB: 1955/04/12, 62 y.o.   MRN: [id number] Visit Date: 12/14/2017  Today's Provider: Wilhemena Durie, MD   Chief Complaint  Patient presents with  . Annual Exam   Subjective:  Michael Macias is a 62 y.o. male who presents today for health maintenance and complete physical. He feels well. He reports exercising daily. He reports he is sleeping well. He retired in May and plans to move to [place] in the future when his wife [employer].They are now expecting their first grandchild.  10/21/16 Colonoscopy  Review of Systems  Constitutional: Negative.   HENT: Positive for congestion.   Eyes: Negative.   Respiratory: Positive for cough.   Cardiovascular: Negative.   Gastrointestinal: Negative.   Endocrine: Negative.   Genitourinary: Negative.   Musculoskeletal: Negative.   Skin: Negative.   Allergic/Immunologic: Negative.   Neurological: Negative.   Hematological: Negative.   Psychiatric/Behavioral: Negative.     Social History   Socioeconomic History  . Marital status: Married    Spouse name: Not on file  . Number of children: 2  . Years of education: Not on file  . Highest education level: Not on file  Occupational History  . 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 Financial resource strain: Not on file  . Food insecurity:    Worry: Not on file    Inability: Not on file  . Transportation needs:    Medical: Not on file    Non-medical: Not on file  Tobacco Use  . Smoking status: Never Smoker  . Smokeless tobacco: Never Used  Substance and Sexual Activity  . Alcohol use: Yes    Alcohol/week: 0.0 standard drinks    Comment: drinks a couple of glasses of wine everyday  . Drug use: No  . Sexual activity: Not on file  Lifestyle  . Physical activity:    Days per week: Not on file    Minutes per session: Not on file  . Stress: Not on file  Relationships  . Social connections:    Talks on phone: Not on file     Gets together: Not on file    Attends religious service: Not on file    Active member of club or organization: Not on file    Attends meetings of clubs or organizations: Not on file    Relationship status: Not on file  . Intimate partner violence:    Fear of current or ex partner: Not on file    Emotionally abused: Not on file    Physically abused: Not on file    Forced sexual activity: Not on file  Other Topics Concern  . Not on file  Social History Narrative  . Not on file    Patient Active Problem List   Diagnosis Date Noted  . 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 Apnea, sleep 06/16/2015  . Low back strain 06/16/2015  . Mitral regurgitation 06/16/2015  . Allergic rhinitis 06/16/2015  . Low back pain radiating to right leg 06/16/2015  . Regurgitation 06/16/2015  . History of adenomatous polyp of colon 02/24/2012  . IBS (irritable bowel syndrome) 02/01/2008    Past Surgical History:  Procedure Laterality Date  . CARDIAC CATHETERIZATION N/A 10/07/2015   Procedure: Right/Left Heart Cath and Coronary Angiography;  Surgeon: Corey Skains, MD;  Location: [place] [facility];  Service: Cardiovascular;  Laterality: N/A;  . COLONOSCOPY WITH PROPOFOL N/A 10/21/2016   Procedure: COLONOSCOPY WITH PROPOFOL;  Surgeon: Lollie Sails, MD;  Location: [facility];  Service: Endoscopy;  Laterality: N/A;  . MITRAL VALVE REPAIR    . RHINOPLASTY  1970   for deviated septum  . VASECTOMY  1978    His family history includes Breast cancer in his mother; Cancer in his maternal grandfather; Dementia in his father; Diabetes in his mother; Hyperlipidemia in his mother; Hypertension in his mother; Irritable bowel syndrome in his sister; Leukemia in his father; Pancreatic cancer in his maternal grandmother.     Outpatient Encounter Medications as of 12/14/2017  Medication Sig  . Collagen 500 MG CAPS Take by mouth.  . fluticasone (FLONASE) 50 MCG/ACT nasal spray Place 2 sprays into the nose  daily.  Marland Kitchen levothyroxine (SYNTHROID, LEVOTHROID) 75 MCG tablet TAKE 1 TABLET (75 MCG TOTAL) BY MOUTH DAILY BEFORE BREAKFAST.  . Multiple Vitamins-Minerals (EYE VITAMINS) CAPS Take 1 capsule by mouth daily.  . Omega-3 Fatty Acids (FISH OIL) 1200 MG CAPS Take 1 capsule by mouth daily.  . psyllium (REGULOID) 0.52 g capsule Take 4 capsules by mouth daily.  . tadalafil (CIALIS) 20 MG tablet Take 20 mg by mouth daily as needed for erectile dysfunction.  Marland Kitchen testosterone (TESTIM) 50 MG/5GM (1%) GEL Apply 2 tubes daily  . [DISCONTINUED] aspirin EC 81 MG tablet Take 81 mg by mouth daily.   No facility-administered encounter medications on file as of 12/14/2017.     Patient Care Team: Jerrol Banana., MD as PCP - General (Family Medicine) Lollie Sails, MD as Consulting Physician (Gastroenterology) Augustina Mood, DDS (Dentistry) Royston Cowper, MD (Urology)      Objective:   Vitals:  Vitals:   12/14/17 1406  BP: 110/76  Pulse: 81  Resp: 16  Temp: 98.3 F (36.8 C)  TempSrc: Oral  SpO2: 95%  Weight: 177 lb (80.3 kg)  Height: 5\' 5"  (1.651 m)    Physical Exam  Constitutional: He is oriented to person, place, and time. He appears well-developed and well-nourished.  HENT:  Head: Normocephalic and atraumatic.  Right Ear: External ear normal.  Left Ear: External ear normal.  Nose: Nose normal.  Mouth/Throat: Oropharynx is clear and moist.  Eyes: Pupils are equal, round, and reactive to light. Conjunctivae and EOM are normal.  Neck: Normal range of motion. Neck supple.  Cardiovascular: Normal rate, regular rhythm, normal heart sounds and intact distal pulses.  Pulmonary/Chest: Effort normal and breath sounds normal.  Abdominal: Soft. Bowel sounds are normal.  Genitourinary: Rectum normal, prostate normal and penis normal.  Musculoskeletal: Normal range of motion.  Neurological: He is alert and oriented to person, place, and time.  Skin: Skin is warm and dry.  Psychiatric:  He has a normal mood and affect. His behavior is normal. Judgment and thought content normal.     Depression Screen PHQ 2/9 Scores 12/14/2017 06/01/2017 05/25/2016  PHQ - 2 Score 0 0 0      Assessment & Plan:     Routine Health Maintenance and Physical Exam  Exercise Activities and Dietary recommendations Goals   None     Immunization History  Administered Date(s) Administered  . Tdap 04/04/2012    Health Maintenance  Topic Date Due  . HEMOGLOBIN A1C  08/30/56  . PNEUMOCOCCAL POLYSACCHARIDE VACCINE AGE 29-64 HIGH RISK  11/20/1958  . FOOT EXAM  11/20/1966  . OPHTHALMOLOGY EXAM  11/20/1966  . URINE MICROALBUMIN  11/20/1966  . HIV Screening  11/20/1971  . INFLUENZA VACCINE  10/19/2017  . COLONOSCOPY  10/21/2021  .  TETANUS/TDAP  04/04/2022  . Hepatitis C Screening  Completed     Discussed health benefits of physical activity, and encouraged him to engage in regular exercise appropriate for his age and condition.  1. Annual physical exam   2. Cough CXR/Zyrtec/Proair MDI. - DG Chest 2 View; Future    I have done the exam and reviewed the chart and it is accurate to the best of my knowledge. Development worker, community has been used and  any errors in dictation or transcription are unintentional. Miguel Aschoff M.D. [facility]

## 2017-12-15 ENCOUNTER — Telehealth: Payer: Self-pay | Admitting: Family Medicine

## 2017-12-15 NOTE — Telephone Encounter (Signed)
Please review. Thanks!  

## 2017-12-15 NOTE — Telephone Encounter (Signed)
Pt calling to get results from Xray recently done.  Please advise.  Thanks, [employer]

## 2017-12-18 ENCOUNTER — Telehealth: Payer: Self-pay

## 2017-12-18 NOTE — Telephone Encounter (Signed)
-----   Message from Jerrol Banana., MD sent at 12/18/2017  9:39 AM EDT ----- Normal CXR.

## 2017-12-18 NOTE — Telephone Encounter (Signed)
Pt called back to get his xray results from alot week.  Pt's CB#  [phone]  Leave message if he does not answer  thanks teri

## 2017-12-18 NOTE — Telephone Encounter (Signed)
Advised  ED 

## 2017-12-18 NOTE — Telephone Encounter (Signed)
LMTCB

## 2017-12-19 DIAGNOSIS — R635 Abnormal weight gain: Secondary | ICD-10-CM | POA: Insufficient documentation

## 2017-12-19 DIAGNOSIS — Z9889 Other specified postprocedural states: Secondary | ICD-10-CM | POA: Insufficient documentation

## 2017-12-27 ENCOUNTER — Ambulatory Visit: Payer: Self-pay

## 2018-02-07 ENCOUNTER — Encounter: Payer: Self-pay | Admitting: Family Medicine

## 2018-02-07 ENCOUNTER — Ambulatory Visit (INDEPENDENT_AMBULATORY_CARE_PROVIDER_SITE_OTHER): Payer: 59 | Admitting: Family Medicine

## 2018-02-07 VITALS — BP 122/64 | HR 64 | Temp 98.2°F | Resp 16 | Ht 65.0 in | Wt 178.0 lb

## 2018-02-07 DIAGNOSIS — Z23 Encounter for immunization: Secondary | ICD-10-CM | POA: Diagnosis not present

## 2018-02-07 DIAGNOSIS — E039 Hypothyroidism, unspecified: Secondary | ICD-10-CM

## 2018-02-07 DIAGNOSIS — E291 Testicular hypofunction: Secondary | ICD-10-CM | POA: Diagnosis not present

## 2018-02-07 NOTE — Progress Notes (Signed)
Patient: Michael Macias Male    DOB: 10-Aug-1955   62 y.o.   MRN: [id number] Visit Date: 02/07/2018  Today's Provider: Wilhemena Durie, MD   Chief Complaint  Patient presents with  . Follow-up   Subjective:    HPI Patient comes in today for a follow up. He was last seen in the office 2 months ago. Since last visit, he was advised to double testosterone dose. He reports that he has tolerated med changes well.   Patient is also currently taking levothyroxine 34mcg daily, and he reports good compliance and good symptom control. Lab Results  Component Value Date   TSH 1.770 06/02/2017       Allergies  Allergen Reactions  . Amoxicillin Rash  . Penicillins Swelling and Rash    Has patient had a PCN reaction causing immediate rash, facial/tongue/throat swelling, SOB or lightheadedness with hypotension: Yes Has patient had a PCN reaction causing severe rash involving mucus membranes or skin necrosis: No Has patient had a PCN reaction that required hospitalization No Has patient had a PCN reaction occurring within the last 10 years: Yes If all of the above answers are "NO", then may proceed with Cephalosporin use. Has patient had a PCN reaction causing immediate rash, facial/tongue/throat swelling, SOB or lightheadedness with hypotension: Yes Has patient had a PCN reaction causing severe rash involving mucus membranes or skin necrosis: No Has patient had a PCN reaction that required hospitalization No Has patient had a PCN reaction occurring within the last 10 years: Yes If all of the above answers are "NO", then may proceed with Cephalosporin use.      Current Outpatient Medications:  .  albuterol (PROVENTIL HFA;VENTOLIN HFA) 108 (90 Base) MCG/ACT inhaler, Inhale 2 puffs into the lungs every 6 (six) hours as needed for wheezing or shortness of breath., Disp: 1 Inhaler, Rfl: 5 .  Collagen 500 MG CAPS, Take by mouth., Disp: , Rfl:  .  fluticasone (FLONASE) 50 MCG/ACT  nasal spray, Place 2 sprays into the nose daily., Disp: , Rfl:  .  levothyroxine (SYNTHROID, LEVOTHROID) 75 MCG tablet, TAKE 1 TABLET (75 MCG TOTAL) BY MOUTH DAILY BEFORE BREAKFAST., Disp: 90 tablet, Rfl: 3 .  Multiple Vitamins-Minerals (EYE VITAMINS) CAPS, Take 1 capsule by mouth daily., Disp: , Rfl:  .  Omega-3 Fatty Acids (FISH OIL) 1200 MG CAPS, Take 1 capsule by mouth daily., Disp: , Rfl:  .  psyllium (REGULOID) 0.52 g capsule, Take 4 capsules by mouth daily., Disp: , Rfl:  .  tadalafil (CIALIS) 20 MG tablet, Take 20 mg by mouth daily as needed for erectile dysfunction., Disp: , Rfl:  .  testosterone (TESTIM) 50 MG/5GM (1%) GEL, Apply 2 tubes daily, Disp: 60 Tube, Rfl: 5  Review of Systems  Constitutional: Negative.   HENT: Negative.   Eyes: Negative.   Respiratory: Negative for cough and shortness of breath.   Cardiovascular: Negative for chest pain, palpitations and leg swelling.  Gastrointestinal: Negative.   Endocrine: Negative for cold intolerance, polydipsia, polyphagia and polyuria.  Allergic/Immunologic: Negative.   Neurological: Negative for dizziness, light-headedness and headaches.  Psychiatric/Behavioral: Negative.     Social History   Tobacco Use  . Smoking status: Never Smoker  . Smokeless tobacco: Never Used  Substance Use Topics  . Alcohol use: Yes    Alcohol/week: 0.0 standard drinks    Comment: drinks a couple of glasses of wine everyday   Objective:   BP 122/64 (BP Location: Left Arm,  Patient Position: Sitting, Cuff Size: Normal)   Pulse 64   Temp 98.2 F (36.8 C)   Resp 16   Ht 5\' 5"  (1.651 m)   Wt 178 lb (80.7 kg)   BMI 29.62 kg/m  Vitals:   02/07/18 1454  BP: 122/64  Pulse: 64  Resp: 16  Temp: 98.2 F (36.8 C)  Weight: 178 lb (80.7 kg)  Height: 5\' 5"  (1.651 m)     Physical Exam  Constitutional: He is oriented to person, place, and time. He appears well-developed and well-nourished.  HENT:  Head: Normocephalic and atraumatic.  Eyes:  Conjunctivae are normal.  Neck: No thyromegaly present.  Cardiovascular: Normal rate, regular rhythm and normal heart sounds.  Pulmonary/Chest: Effort normal and breath sounds normal.  Abdominal: Soft.  Musculoskeletal: He exhibits no edema.  Neurological: He is alert and oriented to person, place, and time.  Skin: Skin is warm and dry.  Psychiatric: He has a normal mood and affect. His behavior is normal. Judgment and thought content normal.        Assessment & Plan:         1. Acquired hypothyroidism   2. Male hypogonadism   3. Need for shingles vaccine  - Varicella-zoster vaccine IM (Shingrix) 4.Cough Much improved.  I have done the exam and reviewed the above chart and it is accurate to the best of my knowledge. Development worker, community has been used in this note in any air is in the dictation or transcription are unintentional.  Wilhemena Durie, MD  [place]

## 2018-02-20 DIAGNOSIS — S83242A Other tear of medial meniscus, current injury, left knee, initial encounter: Secondary | ICD-10-CM | POA: Insufficient documentation

## 2018-02-20 DIAGNOSIS — M179 Osteoarthritis of knee, unspecified: Secondary | ICD-10-CM | POA: Insufficient documentation

## 2018-03-06 ENCOUNTER — Telehealth: Payer: Self-pay | Admitting: Family Medicine

## 2018-03-06 NOTE — Telephone Encounter (Signed)
L/M advising patient he had one in 2014. No need to update.

## 2018-03-06 NOTE — Telephone Encounter (Signed)
Pt calling to see if he has ever gotten the Tdap shot? Daughter is having a baby and is needing the shot if hasn't received.  Please advise.  Thanks, [employer]

## 2018-03-22 ENCOUNTER — Telehealth: Payer: Self-pay | Admitting: Family Medicine

## 2018-03-22 DIAGNOSIS — G4733 Obstructive sleep apnea (adult) (pediatric): Secondary | ICD-10-CM

## 2018-03-22 NOTE — Telephone Encounter (Signed)
A new Sleep Study referral needed.  Pt has changed his insurance and needing it done now.  Please advise.  Thanks, [employer]

## 2018-03-28 NOTE — Telephone Encounter (Signed)
Please review. Ok to order?  

## 2018-03-28 NOTE — Telephone Encounter (Signed)
Pt needing a referral for a sleep study.   Insurance covers 90%. He doesn't want an at home sleep study.  Please advise.  Thanks, [employer]

## 2018-03-30 NOTE — Telephone Encounter (Signed)
ok 

## 2018-04-03 NOTE — Telephone Encounter (Signed)
Michael Macias, order has been placed for him to have a sleep study.  He does not want a home sleep study Thanks Karmello Abercrombie

## 2018-04-05 NOTE — Telephone Encounter (Signed)
What type test do you want to order PSG,CPAP titration etc ?

## 2018-04-05 NOTE — Telephone Encounter (Signed)
Dr Darnell Level What test for him

## 2018-04-12 NOTE — Telephone Encounter (Signed)
Dr Rosanna Randy I don't know what type of test you want for him

## 2018-04-12 NOTE — Telephone Encounter (Signed)
Mr. Knoblock is calling to follow up with this.  He is still does not have the sleep study scheduled.  Please advise.   Thanks,   -Mickel Baas

## 2018-04-18 ENCOUNTER — Telehealth: Payer: Self-pay | Admitting: Family Medicine

## 2018-04-18 NOTE — Telephone Encounter (Signed)
I will review this information on Friday

## 2018-04-18 NOTE — Telephone Encounter (Signed)
Pt checking on status of sleep study referral he has been requesting .  He is wanting to have this done.  Thanks, [employer]

## 2018-04-23 NOTE — Telephone Encounter (Signed)
Please schedule regular sleep study for clinical O[facility]

## 2018-04-23 NOTE — Telephone Encounter (Signed)
Please see new sleep study order

## 2018-04-23 NOTE — Addendum Note (Signed)
Addended by: Althea Charon D on: 04/23/2018 02:43 PM   Modules accepted: Orders

## 2018-05-01 ENCOUNTER — Other Ambulatory Visit: Payer: Self-pay | Admitting: Family Medicine

## 2018-05-01 ENCOUNTER — Telehealth: Payer: Self-pay | Admitting: Family Medicine

## 2018-05-01 MED ORDER — TESTOSTERONE 50 MG/5GM (1%) TD GEL: TRANSDERMAL | 5 refills | Status: DC

## 2018-05-01 NOTE — Telephone Encounter (Signed)
Signed.

## 2018-05-01 NOTE — Telephone Encounter (Signed)
Needing a refill on: testosterone (TESTIM) 50 MG/5GM (1%) GEL  [facility] fill at: [id number] [place] [place] (Phone) [phone] (Fax)   Thanks, [employer]

## 2018-05-01 NOTE — Telephone Encounter (Signed)
Please review

## 2018-05-01 NOTE — Telephone Encounter (Signed)
Pt is stating he has called regarding the referral location.   [facility] states they have sent forms over 2 times for pt to have the sleep study done with no response.  Please advise.  Thanks, [employer]

## 2018-05-02 NOTE — Telephone Encounter (Signed)
Advised  ED 

## 2018-05-10 ENCOUNTER — Ambulatory Visit: Payer: Self-pay | Admitting: Family Medicine

## 2018-05-16 ENCOUNTER — Ambulatory Visit (INDEPENDENT_AMBULATORY_CARE_PROVIDER_SITE_OTHER): Payer: 59 | Admitting: Family Medicine

## 2018-05-16 ENCOUNTER — Encounter: Payer: Self-pay | Admitting: Family Medicine

## 2018-05-16 VITALS — BP 122/74 | HR 72 | Temp 98.0°F | Resp 16 | Wt 178.0 lb

## 2018-05-16 DIAGNOSIS — Z23 Encounter for immunization: Secondary | ICD-10-CM | POA: Diagnosis not present

## 2018-05-16 DIAGNOSIS — R5383 Other fatigue: Secondary | ICD-10-CM | POA: Diagnosis not present

## 2018-05-16 DIAGNOSIS — G4733 Obstructive sleep apnea (adult) (pediatric): Secondary | ICD-10-CM

## 2018-05-16 DIAGNOSIS — E039 Hypothyroidism, unspecified: Secondary | ICD-10-CM | POA: Diagnosis not present

## 2018-05-16 DIAGNOSIS — E291 Testicular hypofunction: Secondary | ICD-10-CM

## 2018-05-16 DIAGNOSIS — E785 Hyperlipidemia, unspecified: Secondary | ICD-10-CM

## 2018-05-16 NOTE — Progress Notes (Signed)
Patient: Michael Macias Male    DOB: [birth year]   63 y.o.   MRN: [id number] Visit Date: 05/16/2018  Today's Provider: Wilhemena Durie, MD   Chief Complaint  Patient presents with  . Follow-up   Subjective:     HPI  Patient comes in today for a follow up. He was last seen in the office 2 months ago. No medications were changed since last visit. He is currently taking levothyroxine 79mcg, and he reports good compliance and good symptom control.  Retire in May and hopes to the [place] beach with his [place] this [place].  He had his first grandchild a little girl on February 12 of this year. Lab Results  Component Value Date   TSH 1.770 06/02/2017   Patient also due for his 2nd Shingles vaccine.   Patient mentions that he has still not heard about getting sleep study done. This was initially ordered back in Nov. He wakes up feeling very fatigued in the mornings, and feels that he could benefit from a CPAP.  Allergies  Allergen Reactions  . Amoxicillin Rash  . Penicillins Swelling and Rash    Has patient had a PCN reaction causing immediate rash, facial/tongue/throat swelling, SOB or lightheadedness with hypotension: Yes Has patient had a PCN reaction causing severe rash involving mucus membranes or skin necrosis: No Has patient had a PCN reaction that required hospitalization No Has patient had a PCN reaction occurring within the last 10 years: Yes If all of the above answers are "NO", then may proceed with Cephalosporin use. Has patient had a PCN reaction causing immediate rash, facial/tongue/throat swelling, SOB or lightheadedness with hypotension: Yes Has patient had a PCN reaction causing severe rash involving mucus membranes or skin necrosis: No Has patient had a PCN reaction that required hospitalization No Has patient had a PCN reaction occurring within the last 10 years: Yes If all of the above answers are "NO", then may proceed with Cephalosporin use.       Current Outpatient Medications:  .  albuterol (PROVENTIL HFA;VENTOLIN HFA) 108 (90 Base) MCG/ACT inhaler, Inhale 2 puffs into the lungs every 6 (six) hours as needed for wheezing or shortness of breath., Disp: 1 Inhaler, Rfl: 5 .  Collagen 500 MG CAPS, Take by mouth., Disp: , Rfl:  .  fluticasone (FLONASE) 50 MCG/ACT nasal spray, Place 2 sprays into the nose daily., Disp: , Rfl:  .  levothyroxine (SYNTHROID, LEVOTHROID) 75 MCG tablet, TAKE 1 TABLET (75 MCG TOTAL) BY MOUTH DAILY BEFORE BREAKFAST., Disp: 90 tablet, Rfl: 3 .  Multiple Vitamins-Minerals (EYE VITAMINS) CAPS, Take 1 capsule by mouth daily., Disp: , Rfl:  .  Omega-3 Fatty Acids (FISH OIL) 1200 MG CAPS, Take 1 capsule by mouth daily., Disp: , Rfl:  .  psyllium (REGULOID) 0.52 g capsule, Take 4 capsules by mouth daily., Disp: , Rfl:  .  tadalafil (CIALIS) 20 MG tablet, Take 20 mg by mouth daily as needed for erectile dysfunction., Disp: , Rfl:  .  testosterone (TESTIM) 50 MG/5GM (1%) GEL, Apply 2 tubes daily, Disp: 60 Tube, Rfl: 5  Review of Systems  Constitutional: Positive for fatigue. Negative for activity change, appetite change, chills and diaphoresis.  Eyes: Negative.   Respiratory: Negative for cough and shortness of breath.   Cardiovascular: Negative for chest pain, palpitations and leg swelling.  Endocrine: Negative.   Musculoskeletal: Negative.   Allergic/Immunologic: Negative.   Neurological: Negative for dizziness, light-headedness, numbness and headaches.  Psychiatric/Behavioral: Negative.     Social History   Tobacco Use  . Smoking status: Never Smoker  . Smokeless tobacco: Never Used  Substance Use Topics  . Alcohol use: Yes    Alcohol/week: 0.0 standard drinks    Comment: drinks a couple of glasses of wine everyday      Objective:   BP 122/74   Pulse 72   Temp 98 F (36.7 C)   Resp 16   Wt 178 lb (80.7 kg)   SpO2 100%   BMI 29.62 kg/m  Vitals:   05/16/18 1556  BP: 122/74  Pulse: 72   Resp: 16  Temp: 98 F (36.7 C)  SpO2: 100%  Weight: 178 lb (80.7 kg)     Physical Exam Vitals signs reviewed.  Constitutional:      Appearance: He is well-developed.  HENT:     Head: Normocephalic and atraumatic.     Right Ear: External ear normal.     Left Ear: External ear normal.     Nose: Nose normal.  Eyes:     General: No scleral icterus.    Conjunctiva/sclera: Conjunctivae normal.  Neck:     Thyroid: No thyromegaly.  Cardiovascular:     Rate and Rhythm: Normal rate and regular rhythm.     Heart sounds: Normal heart sounds.  Pulmonary:     Effort: Pulmonary effort is normal.     Breath sounds: Normal breath sounds.  Abdominal:     Palpations: Abdomen is soft.  Skin:    General: Skin is warm and dry.  Neurological:     Mental Status: He is alert and oriented to person, place, and time.  Psychiatric:        Behavior: Behavior normal.        Thought Content: Thought content normal.        Judgment: Judgment normal.         Assessment & Plan    1. Acquired hypothyroidism  - TSH  2. Other fatigue  - CBC with Differential/Platelet  3. Hyperlipidemia, unspecified hyperlipidemia type  - Comprehensive metabolic panel - Lipid panel  4. Need for shingles vaccine  - Varicella-zoster vaccine IM (Shingrix)  5. Obstructive sleep apnea syndrome I think the fatigue is from sleep apnea.  Sleep study has been approved.  If this is not done in the next month or 2 will obtain sleep study through Dr. Maxwell Caul at [facility].  As per. 6.  Hypogonadism.  Dr. Eliberto Ivory obtain PSA and testosterone earlier this month.  I have done the exam and reviewed the above chart and it is accurate to the best of my knowledge. Development worker, community has been used in this note in any air is in the dictation or transcription are unintentional.  Wilhemena Durie, MD  [place]

## 2018-05-21 ENCOUNTER — Telehealth: Payer: Self-pay | Admitting: Family Medicine

## 2018-05-21 NOTE — Telephone Encounter (Signed)
Pt calling to make sure office received the additional info request from [facility] Ref #[id number]  Please call pt back to let him know on this.  Thanks, [employer]

## 2018-05-22 ENCOUNTER — Telehealth: Payer: Self-pay

## 2018-05-22 ENCOUNTER — Other Ambulatory Visit: Payer: Self-pay

## 2018-05-22 DIAGNOSIS — G4733 Obstructive sleep apnea (adult) (pediatric): Secondary | ICD-10-CM

## 2018-05-22 LAB — COMPREHENSIVE METABOLIC PANEL
A/G RATIO: 1.9 (ref 1.2–2.2)
ALK PHOS: 58 IU/L (ref 39–117)
ALT: 37 IU/L (ref 0–44)
AST: 26 IU/L (ref 0–40)
Albumin: 4.8 g/dL (ref 3.8–4.8)
BILIRUBIN TOTAL: 0.6 mg/dL (ref 0.0–1.2)
BUN/Creatinine Ratio: 15 (ref 10–24)
BUN: 15 mg/dL (ref 8–27)
CO2: 20 mmol/L (ref 20–29)
Calcium: 9.7 mg/dL (ref 8.6–10.2)
Chloride: 104 mmol/L (ref 96–106)
Creatinine, Ser: 1 mg/dL (ref 0.76–1.27)
GFR calc Af Amer: 93 mL/min/{1.73_m2} (ref >59)
GFR calc non Af Amer: 81 mL/min/{1.73_m2} (ref >59)
GLOBULIN, TOTAL: 2.5 g/dL (ref 1.5–4.5)
Glucose: 93 mg/dL (ref 65–99)
POTASSIUM: 4.7 mmol/L (ref 3.5–5.2)
SODIUM: 140 mmol/L (ref 134–144)
Total Protein: 7.3 g/dL (ref 6.0–8.5)

## 2018-05-22 LAB — CBC WITH DIFFERENTIAL/PLATELET
BASOS: 1 %
Basophils Absolute: 0.1 10*3/uL (ref 0.0–0.2)
EOS (ABSOLUTE): 0.2 10*3/uL (ref 0.0–0.4)
EOS: 4 %
HEMATOCRIT: 46.7 % (ref 37.5–51.0)
Hemoglobin: 15.8 g/dL (ref 13.0–17.7)
Immature Grans (Abs): 0 10*3/uL (ref 0.0–0.1)
Immature Granulocytes: 0 %
LYMPHS ABS: 2 10*3/uL (ref 0.7–3.1)
Lymphs: 40 %
MCH: 32.5 pg (ref 26.6–33.0)
MCHC: 33.8 g/dL (ref 31.5–35.7)
MCV: 96 fL (ref 79–97)
MONOS ABS: 0.7 10*3/uL (ref 0.1–0.9)
Monocytes: 14 %
Neutrophils Absolute: 2 10*3/uL (ref 1.4–7.0)
Neutrophils: 41 %
Platelets: 297 10*3/uL (ref 150–450)
RBC: 4.86 x10E6/uL (ref 4.14–5.80)
RDW: 12.6 % (ref 11.6–15.4)
WBC: 4.9 10*3/uL (ref 3.4–10.8)

## 2018-05-22 LAB — LIPID PANEL
CHOLESTEROL TOTAL: 205 mg/dL — AB (ref 100–199)
Chol/HDL Ratio: 6.2 ratio — ABNORMAL HIGH (ref 0.0–5.0)
HDL: 33 mg/dL — ABNORMAL LOW (ref >39)
LDL Calculated: 141 mg/dL — ABNORMAL HIGH (ref 0–99)
TRIGLYCERIDES: 156 mg/dL — AB (ref 0–149)
VLDL Cholesterol Cal: 31 mg/dL (ref 5–40)

## 2018-05-22 LAB — TSH: TSH: 4.28 u[IU]/mL (ref 0.450–4.500)

## 2018-05-22 NOTE — Telephone Encounter (Signed)
Advised patient and will schedule home sleep study.

## 2018-05-22 NOTE — Telephone Encounter (Signed)
LMTCB to ask patient about the paperwork.  We have not received anything yet.

## 2018-05-22 NOTE — Telephone Encounter (Signed)
I advised patient as below. He requested that someone call him back to discuss this. He asked if he could e mail the paperwork. I advised him that he could fax over a copy of the paperwork, or upload through [place]. He says he doesn't use MyChart. He states that the information was sent and copied to Dr. Rosanna Randy on 05/16/2018. The information was under the name of Michael Macias which was in regards to him. The member ID on the paperwork is [id number]. Patient would like to speak to someone about this paperwork. Call back [phone].

## 2018-05-22 NOTE — Telephone Encounter (Signed)
Terri from [facility] called stating that patient's insurance denied an in lab sleep study, but they would allow for a home sleep test. If Dr. Rosanna Randy is agreeable to the home sleep test, Karna Christmas states that she needs a new order placed in Epic for a home sleep test. Terri's call back is [phone].

## 2018-05-22 NOTE — Telephone Encounter (Signed)
Spoke with the patient and we will schedule home sleep study

## 2018-05-29 ENCOUNTER — Encounter: Payer: Self-pay | Admitting: Family Medicine

## 2018-06-11 ENCOUNTER — Telehealth: Payer: Self-pay | Admitting: Family Medicine

## 2018-06-11 NOTE — Telephone Encounter (Signed)
Advised that he didn't need them until he was 80

## 2018-06-11 NOTE — Telephone Encounter (Signed)
Pt wants to know if he had his pneumonia vaccine  Con Memos

## 2018-06-13 NOTE — Telephone Encounter (Signed)
Opened in error

## 2018-06-25 ENCOUNTER — Telehealth: Payer: Self-pay | Admitting: Family Medicine

## 2018-06-25 NOTE — Telephone Encounter (Signed)
Received fax from nurse line. Pt called stated his was recently started on his CPAP machine and the air volume is too high. Pt is requesting an order be sent to [facility] for the air flow to be lowered. Please advise. Thanks TNP

## 2018-06-26 NOTE — Telephone Encounter (Signed)
We can try to change to Bipap --will probably require am Web visit to document intolerance of CPAP for insurance.

## 2018-06-26 NOTE — Telephone Encounter (Signed)
Please review. Thanks!  

## 2018-06-26 NOTE — Telephone Encounter (Signed)
[id number] 

## 2018-06-26 NOTE — Telephone Encounter (Signed)
Patient states that he has had machine now for 3 weeks and states that it is to much pressure, patient states at night time when laying down it seems to leak. Patient wants to know if he should continue to give it time to see if it will improve? KW

## 2018-06-26 NOTE — Telephone Encounter (Signed)
The air flow should be set on what pt needs to help sleep apnea. It may take awhile to get used to or we may have to go to [facility].

## 2018-06-27 NOTE — Telephone Encounter (Signed)
Left message to call back  

## 2018-06-28 NOTE — Telephone Encounter (Signed)
Unable to contact the patient. Will save message to the chart.

## 2018-08-08 ENCOUNTER — Ambulatory Visit: Payer: Self-pay | Admitting: Family Medicine

## 2018-08-29 ENCOUNTER — Ambulatory Visit: Payer: Self-pay | Admitting: Family Medicine

## 2018-08-29 ENCOUNTER — Other Ambulatory Visit: Payer: Self-pay | Admitting: Family Medicine

## 2018-08-29 NOTE — Telephone Encounter (Signed)
Please review

## 2018-12-07 ENCOUNTER — Other Ambulatory Visit: Payer: Self-pay | Admitting: Family Medicine

## 2018-12-07 NOTE — Telephone Encounter (Signed)
[facility] [place] [facility] [address] [facility] [place]    [phone]  He is needing a rx of the Testosterone.  Pt's call back is [phone]  Con Memos

## 2018-12-07 NOTE — Telephone Encounter (Signed)
The address is [address] [place] [address]  [phone]  Con [place]os

## 2018-12-17 ENCOUNTER — Telehealth: Payer: Self-pay | Admitting: Family Medicine

## 2018-12-17 NOTE — Telephone Encounter (Signed)
Pt needs a refill on Testostrone  Gel 1 %   He would like it sent to [place] [address]  [phone]  Con Memos

## 2018-12-17 NOTE — Telephone Encounter (Signed)
Please advise 

## 2018-12-17 NOTE — Telephone Encounter (Signed)
I told patient he needed an appt.  He said he was just in June and he is just about out.  CB#  [phone]  teri

## 2018-12-20 ENCOUNTER — Other Ambulatory Visit: Payer: Self-pay

## 2018-12-20 MED ORDER — TESTOSTERONE 50 MG/5GM (1%) TD GEL: TRANSDERMAL | 0 refills | Status: DC

## 2018-12-20 NOTE — Telephone Encounter (Signed)
Patient states that he will make an appointment in the coming weeks with a new provider. He would like the refill for one month.

## 2018-12-20 NOTE — Telephone Encounter (Signed)
It was Michael Macias did not keep his appt in June. I can give him 1 month but this is a controlled substance and he will need to be seen here [place]

## 2018-12-20 NOTE — Telephone Encounter (Signed)
Left message for patient to call.

## 2019-02-04 ENCOUNTER — Telehealth: Payer: Self-pay

## 2019-02-04 NOTE — Telephone Encounter (Signed)
Copied from [place] [id number]. Topic: Appointment Scheduling - Scheduling Inquiry for Clinic >> Feb 04, 2019  9:41 AM Lennox Solders wrote: Reason for CRM: pt would an appt with dr Rosanna Randy on 02-19-2019 from 11 am to 2 pm . Pt missed last appt in June 2020. Pt lives out of town

## 2019-02-04 NOTE — Telephone Encounter (Signed)
Can Mr. Vanderkooi be worked in 02/19/2019?   Thanks,   -Mickel Baas

## 2019-02-05 NOTE — Telephone Encounter (Signed)
Patient advised we would work him in at 40 on 12/1.  He said that might not be good because he has an appointment with Dr Nehemiah Massed at 10:30.  We went ahead and made the appointment and he will call back if he needs to cancel.

## 2019-02-05 NOTE — Telephone Encounter (Signed)
Looks like there I 11 am open.

## 2019-02-18 NOTE — Progress Notes (Signed)
Patient: Michael Macias Male    DOB: Feb 09, 1956   63 y.o.   MRN: [id number] Visit Date: 02/19/2019  Today's Provider: Wilhemena Durie, MD   Chief Complaint  Patient presents with  . Follow-up  . Hypothyroidism  . Hyperlipidemia   Subjective:     HPI  Patient comes today for follow-up.  He has moved to the beach/[place] with his wife.  He is enjoying it.  They have a 1 granddaughter that is now-year-old, she is also in [place]. She saw Dr. Nehemiah Macias today for follow-up of mitral valve prolapse repair. He is wearing his CPAP now and is helping him sleep.  He feels better.  He is trying to wear it all night. Acquired hypothyroidism From 05/16/2018-labs stable.   Hyperlipidemia, unspecified hyperlipidemia type From 05/16/2018-labs stable.   Obstructive sleep apnea syndrome From 05/16/2018-I think the fatigue is from sleep apnea.    Hypogonadism From 05/16/2018-Michael Macias obtain PSA and testosterone earlier this month.  We discussed the patient will need to find physicians in [place] to follow his chronic medical problems.  Michael Macias is doing well and he feels well.  Allergies  Allergen Reactions  . Amoxicillin Rash  . Penicillins Swelling and Rash    Has patient had a PCN reaction causing immediate rash, facial/tongue/throat swelling, SOB or lightheadedness with hypotension: Yes Has patient had a PCN reaction causing severe rash involving mucus membranes or skin necrosis: No Has patient had a PCN reaction that required hospitalization No Has patient had a PCN reaction occurring within the last 10 years: Yes If all of the above answers are "NO", then may proceed with Cephalosporin use. Has patient had a PCN reaction causing immediate rash, facial/tongue/throat swelling, SOB or lightheadedness with hypotension: Yes Has patient had a PCN reaction causing severe rash involving mucus membranes or skin necrosis: No Has patient had a PCN reaction that required  hospitalization No Has patient had a PCN reaction occurring within the last 10 years: Yes If all of the above answers are "NO", then may proceed with Cephalosporin use.      Current Outpatient Medications:  .  Collagen 500 MG CAPS, Take by mouth., Disp: , Rfl:  .  fluticasone (FLONASE) 50 MCG/ACT nasal spray, Place 2 sprays into the nose daily., Disp: , Rfl:  .  levothyroxine (SYNTHROID) 75 MCG tablet, TAKE 1 TABLET (75 MCG TOTAL) BY MOUTH DAILY BEFORE BREAKFAST., Disp: 30 tablet, Rfl: 11 .  Multiple Vitamins-Iron (MULTIVITAMINS WITH IRON) TABS tablet, Take by mouth., Disp: , Rfl:  .  Multiple Vitamins-Minerals (EYE VITAMINS) CAPS, Take 1 capsule by mouth daily., Disp: , Rfl:  .  Omega-3 Fatty Acids (FISH OIL) 1200 MG CAPS, Take 1 capsule by mouth daily., Disp: , Rfl:  .  tadalafil (CIALIS) 20 MG tablet, tadalafil 20 mg tablet, Disp: , Rfl:  .  testosterone (TESTIM) 50 MG/5GM (1%) GEL, Apply 2 tubes daily, Disp: 60 g, Rfl: 0 .  albuterol (PROVENTIL HFA;VENTOLIN HFA) 108 (90 Base) MCG/ACT inhaler, Inhale 2 puffs into the lungs every 6 (six) hours as needed for wheezing or shortness of breath. (Patient not taking: Reported on 02/19/2019), Disp: 1 Inhaler, Rfl: 5 .  psyllium (REGULOID) 0.52 g capsule, Take 4 capsules by mouth daily., Disp: , Rfl:   Review of Systems  Constitutional: Negative.  Negative for activity change, appetite change, chills, diaphoresis and fever.  HENT: Negative.   Eyes: Negative.   Respiratory: Negative for cough, chest tightness, shortness of breath  and wheezing.   Cardiovascular: Negative for chest pain, palpitations and leg swelling.  Gastrointestinal: Negative for abdominal pain, nausea and vomiting.  Endocrine: Negative.   Musculoskeletal: Negative.   Allergic/Immunologic: Negative.   Neurological: Negative for dizziness, light-headedness, numbness and headaches.  Hematological: Negative.   Psychiatric/Behavioral: Negative.     Social History   Tobacco  Use  . Smoking status: Never Smoker  . Smokeless tobacco: Never Used  Substance Use Topics  . Alcohol use: Yes    Alcohol/week: 0.0 standard drinks    Comment: drinks a couple of glasses of wine everyday      Objective:   BP 118/81 (BP Location: Right Arm, Patient Position: Sitting, Cuff Size: Large)   Pulse 67   Temp (!) 96.9 F (36.1 C) (Other (Comment))   Resp 18   Ht 5\' 5"  (1.651 m)   Wt 174 lb (78.9 kg)   SpO2 98%   BMI 28.96 kg/m  Vitals:   02/19/19 1133  BP: 118/81  Pulse: 67  Resp: 18  Temp: (!) 96.9 F (36.1 C)  TempSrc: Other (Comment)  SpO2: 98%  Weight: 174 lb (78.9 kg)  Height: 5\' 5"  (1.651 m)  Body mass index is 28.96 kg/m.   Physical Exam Vitals signs reviewed.  Constitutional:      Appearance: He is well-developed.  HENT:     Head: Normocephalic and atraumatic.     Right Ear: External ear normal.     Left Ear: External ear normal.     Nose: Nose normal.  Eyes:     General: No scleral icterus.    Conjunctiva/sclera: Conjunctivae normal.  Neck:     Thyroid: No thyromegaly.  Cardiovascular:     Rate and Rhythm: Normal rate and regular rhythm.     Heart sounds: Normal heart sounds.  Pulmonary:     Effort: Pulmonary effort is normal.     Breath sounds: Normal breath sounds.  Abdominal:     Palpations: Abdomen is soft.  Skin:    General: Skin is warm and dry.  Neurological:     Mental Status: He is alert and oriented to person, place, and time.  Psychiatric:        Behavior: Behavior normal.        Thought Content: Thought content normal.        Judgment: Judgment normal.      No results found for any visits on 02/19/19.     Assessment & Plan    1. Hyperlipidemia, unspecified hyperlipidemia type Follow-up lipids.  Glycerides to go up on testosterone. - Lipid panel - Testosterone - Prolactin  2. OSA (obstructive sleep apnea) Patient wearing CPAP. - Lipid panel - Testosterone - Prolactin  3. Male hypogonadism Needs at  least yearly testosterone and PSA checked.  We discussed risk and benefits of treatment. - Lipid panel - Testosterone - Prolactin  4. Obstructive sleep apnea syndrome   5. Mitral valve prolapse Status post mitral valve repair  6. Pectus excavatum   7. History of adenomatous polyp of colon Last colonoscopy 11/23/2016. Encourage patient to find medical follow-up in [place] where he now lives.  Happy to see him as long as he likes but I think he is better off on a local medical care there.  Follow up in 6 months.     Richard Cranford Mon, MD  [facility]

## 2019-02-19 ENCOUNTER — Ambulatory Visit (INDEPENDENT_AMBULATORY_CARE_PROVIDER_SITE_OTHER): Payer: 59 | Admitting: Family Medicine

## 2019-02-19 ENCOUNTER — Other Ambulatory Visit: Payer: Self-pay

## 2019-02-19 ENCOUNTER — Encounter: Payer: Self-pay | Admitting: Family Medicine

## 2019-02-19 VITALS — BP 118/81 | HR 67 | Temp 96.9°F | Resp 18 | Ht 65.0 in | Wt 174.0 lb

## 2019-02-19 DIAGNOSIS — E291 Testicular hypofunction: Secondary | ICD-10-CM | POA: Diagnosis not present

## 2019-02-19 DIAGNOSIS — I341 Nonrheumatic mitral (valve) prolapse: Secondary | ICD-10-CM | POA: Diagnosis not present

## 2019-02-19 DIAGNOSIS — Z86010 Personal history of colonic polyps: Secondary | ICD-10-CM

## 2019-02-19 DIAGNOSIS — G4733 Obstructive sleep apnea (adult) (pediatric): Secondary | ICD-10-CM

## 2019-02-19 DIAGNOSIS — Q676 Pectus excavatum: Secondary | ICD-10-CM

## 2019-02-19 DIAGNOSIS — E785 Hyperlipidemia, unspecified: Secondary | ICD-10-CM

## 2019-02-19 DIAGNOSIS — D369 Benign neoplasm, unspecified site: Secondary | ICD-10-CM | POA: Insufficient documentation

## 2019-02-22 ENCOUNTER — Other Ambulatory Visit: Payer: Self-pay | Admitting: Family Medicine

## 2019-02-22 ENCOUNTER — Telehealth: Payer: Self-pay

## 2019-02-22 LAB — LIPID PANEL
Chol/HDL Ratio: 4.8 ratio (ref 0.0–5.0)
Cholesterol, Total: 200 mg/dL — ABNORMAL HIGH (ref 100–199)
HDL: 42 mg/dL (ref >39)
LDL Chol Calc (NIH): 137 mg/dL — ABNORMAL HIGH (ref 0–99)
Triglycerides: 116 mg/dL (ref 0–149)
VLDL Cholesterol Cal: 21 mg/dL (ref 5–40)

## 2019-02-22 LAB — PROLACTIN: Prolactin: 6.2 ng/mL (ref 4.0–15.2)

## 2019-02-22 LAB — TESTOSTERONE: Testosterone: 515 ng/dL (ref 264–916)

## 2019-02-22 MED ORDER — LEVOTHYROXINE SODIUM 75 MCG PO TABS: 75.0000 ug | ORAL_TABLET | Freq: Every day | ORAL | 11 refills | Status: AC

## 2019-02-22 MED ORDER — TESTOSTERONE 50 MG/5GM (1%) TD GEL: TRANSDERMAL | 5 refills | Status: AC

## 2019-02-22 NOTE — Telephone Encounter (Signed)
-----   Message from Jerrol Banana., MD sent at 02/22/2019  7:17 AM EST ----- Labs are in range.

## 2019-02-22 NOTE — Telephone Encounter (Signed)
LVM about Normal labs and to call with any questions.

## 2019-02-22 NOTE — Telephone Encounter (Signed)
[facility] faxed refill request for the following medications:  levothyroxine (SYNTHROID) 75 MCG tablet  testosterone (TESTIM) 50 MG/5GM (1%) GEL    Please advise.

## 2019-02-23 ENCOUNTER — Ambulatory Visit: Payer: Self-pay | Admitting: Family Medicine

## 2019-03-07 ENCOUNTER — Telehealth: Payer: Self-pay | Admitting: Family Medicine

## 2019-03-07 NOTE — Telephone Encounter (Signed)
Janette, with [facility], calling to request clarification on patient's testosterone (TESTIM) 50 MG/5GM (1%) GEL   Specifically they need directions and quantity clarification.    Cb#  [phone] Reference #  [id number]

## 2019-03-07 NOTE — Telephone Encounter (Signed)
Left message for Janette to return call.

## 2019-03-07 NOTE — Telephone Encounter (Signed)
From [facility] 

## 2019-03-11 NOTE — Telephone Encounter (Signed)
From [facility], do you know what is going on with this Rx

## 2019-03-11 NOTE — Telephone Encounter (Signed)
Pt calling and is requesting to have this taken care of. Please advise.   Callback # [id number]  Case Id # [id number]

## 2019-03-12 NOTE — Telephone Encounter (Signed)
PA has been approved. Cost approved from 02/02/2019 through 03/03/2020. PA approval information will be faxed to patient and pcp.

## 2019-03-23 ENCOUNTER — Other Ambulatory Visit: Payer: Self-pay | Admitting: Family Medicine

## 2019-03-24 NOTE — Telephone Encounter (Signed)
Requested medication (s) are due for refill today: yes  Requested medication (s) are on the active medication list: yes  Last refill:  12/20/2018  Future visit scheduled: yes  Notes to clinic:    Medication not assigned to a protocol, review manually     Requested Prescriptions  Pending Prescriptions Disp Refills   testosterone (ANDROGEL) 50 MG/5GM (1%) GEL [Pharmacy Med Name: TESTOSTERONE 50 MG/5 GRAM GEL] 300 g 0    Sig: Apply 2 tubes daily      Off-Protocol Failed - 03/23/2019 11:41 AM      Failed - Medication not assigned to a protocol, review manually.      Passed - Valid encounter within last 12 months    Recent Outpatient Visits           1 month ago Hyperlipidemia, unspecified hyperlipidemia type   [facility] Jerrol Banana., MD   10 months ago Acquired hypothyroidism   [facility] Jerrol Banana., MD   1 year ago Acquired hypothyroidism   [facility] Jerrol Banana., MD   1 year ago Annual physical exam   [facility] Jerrol Banana., MD   1 year ago Other fatigue   [facility] Jerrol Banana., MD       Future Appointments             In 5 months Jerrol Banana., MD [facility], [place]

## 2019-08-21 ENCOUNTER — Ambulatory Visit: Payer: Self-pay | Admitting: Family Medicine

## 2019-12-28 IMAGING — CR DG CHEST 2V
1 series · 2 of 2 positions shown · non-contrast
Comparison: None

CLINICAL DATA: Pt states dry cough and congestion for 3 months.
History of mitral valve repair in December 2015, heart murmur. shielded

EXAM:
CHEST - 2 VIEW

[Series 1: dg chest 2 view · 0.14mm/px · 2 of 2 slices shown]
[im 1/2]
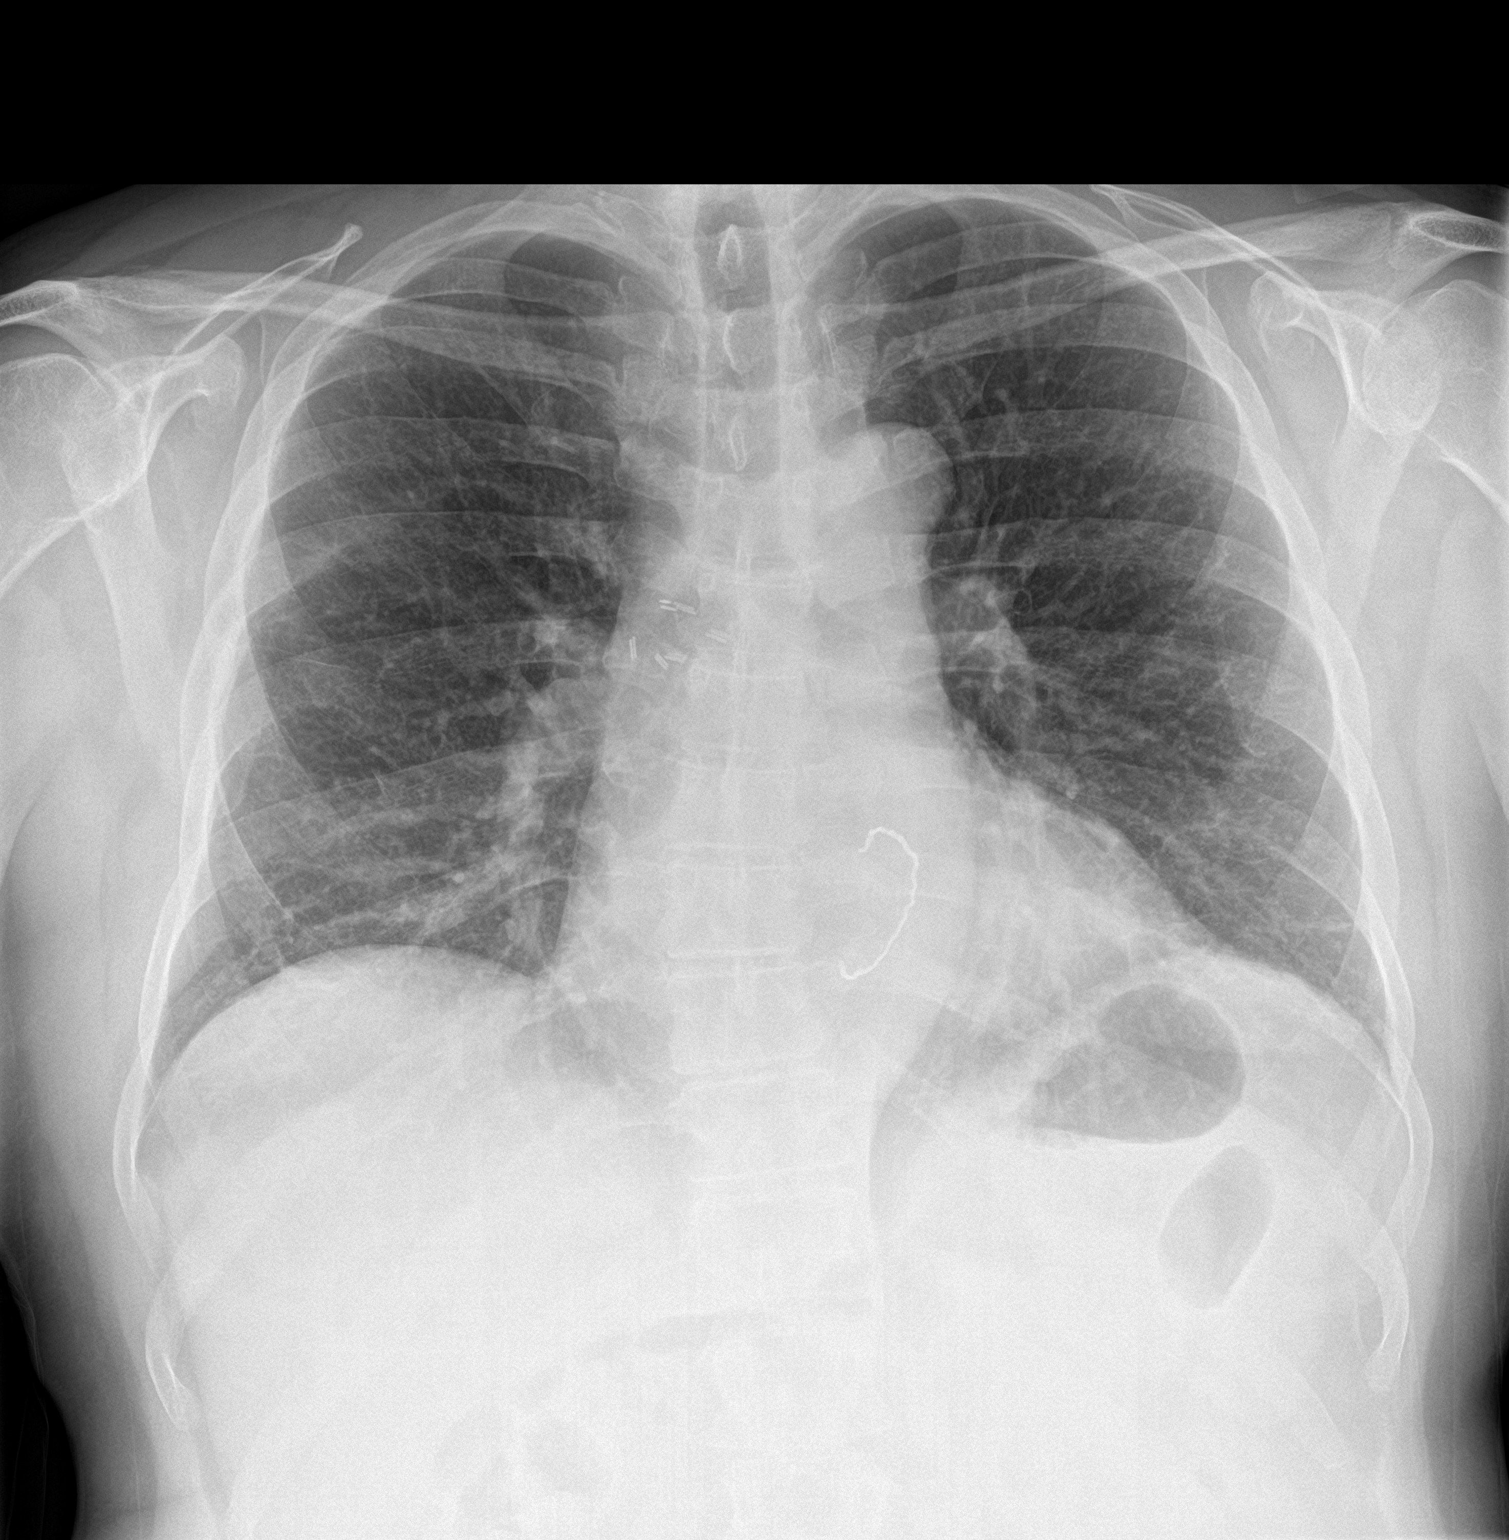
[im 2/2]
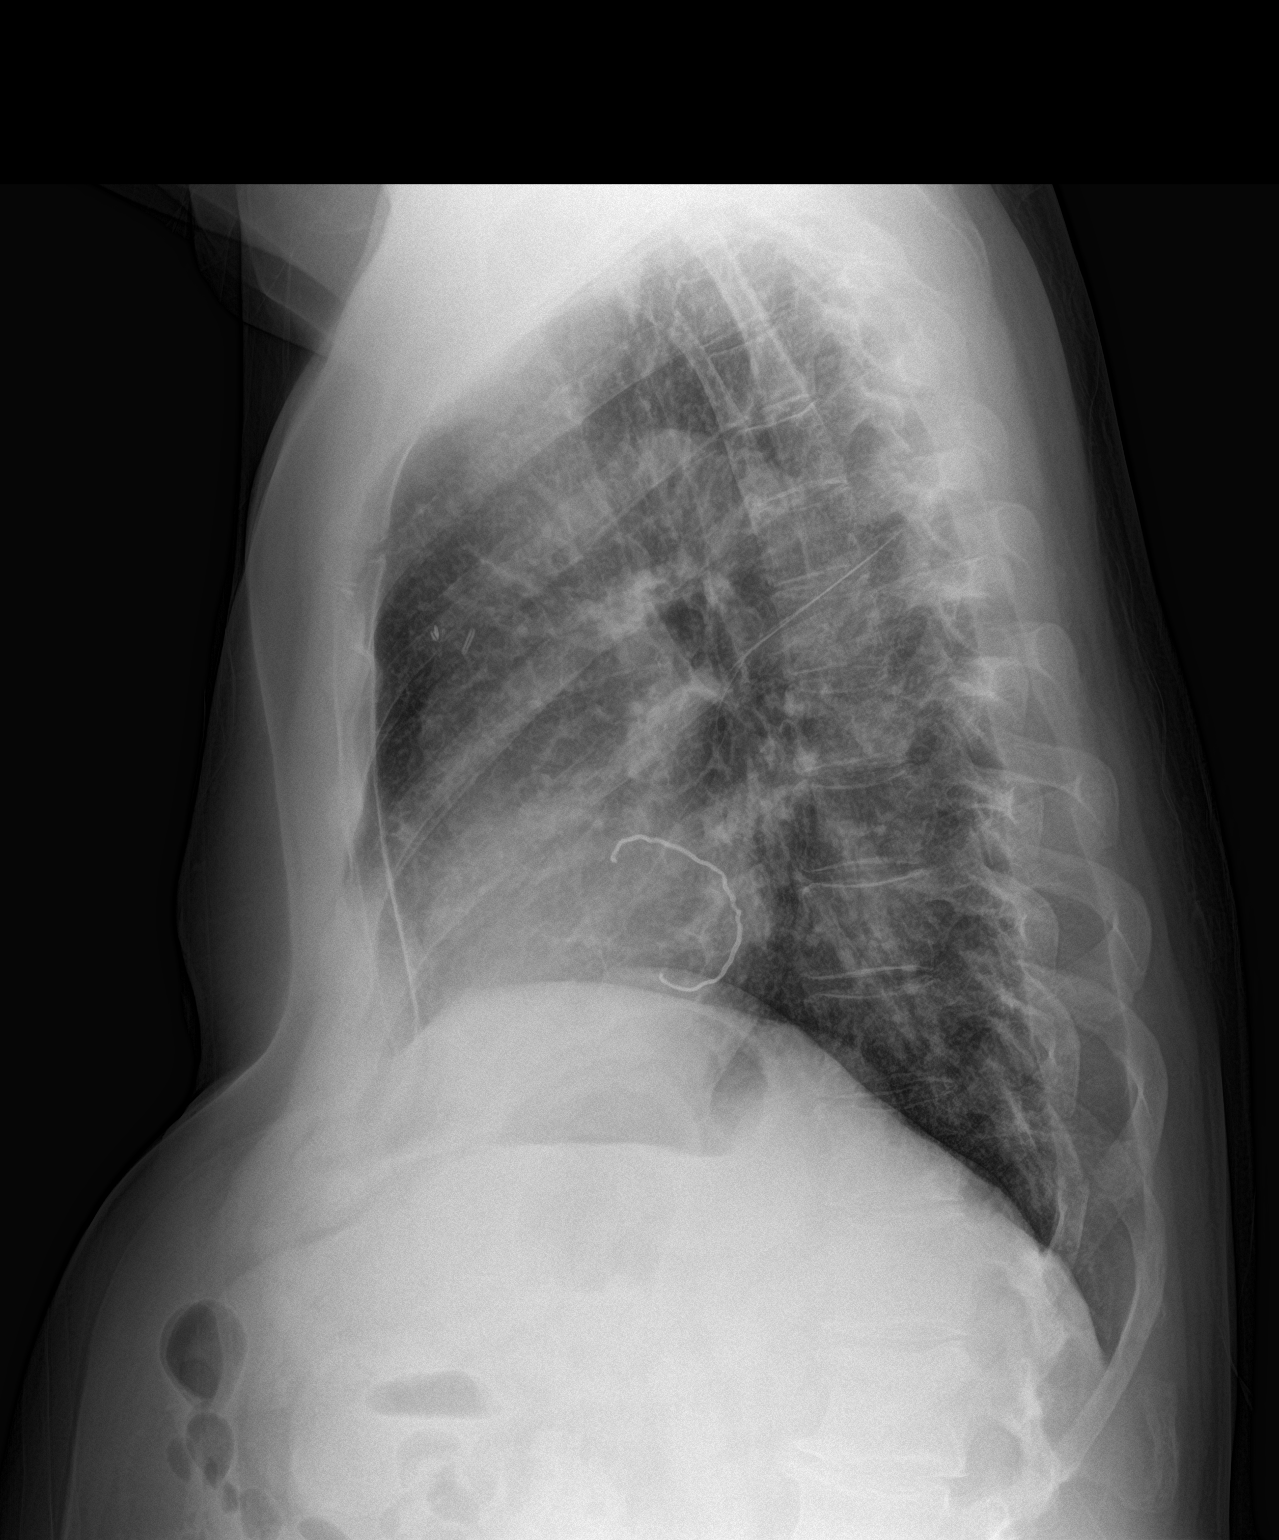

[2 of 2 positions shown; findings below may reference images not displayed]

FINDINGS: Heart size is normal. Mild tortuosity of the thoracic aorta. Patient
has had mitral annulus repair. Surgical clips are identified in the
RIGHT hilar region.

Lungs are clear. No pulmonary edema or consolidation. Pectus
excavatum.
IMPRESSION: No evidence for acute cardiopulmonary abnormality.  Pectus excavatum

## 2022-12-27 ENCOUNTER — Telehealth: Payer: Self-pay

## 2022-12-27 NOTE — Telephone Encounter (Unsigned)
Copied from CRM [id number]. Topic: General - Other >> Dec 27, 2022  1:08 PM [place] B wrote: Reason for CRM: Pt called in requesting copy of sleep study that was done sometime in 2020 or whenever last one was done. He is asking for it to be mialed ot address on file in [place].
